# Patient Record
Sex: Female | Born: 1950
Health system: Southern US, Community
[De-identification: ages and names within clinical notes are randomized; demographics above are authoritative.]

## PROBLEM LIST (undated history)

## (undated) DIAGNOSIS — I10 Essential (primary) hypertension: Secondary | ICD-10-CM

## (undated) DIAGNOSIS — E119 Type 2 diabetes mellitus without complications: Secondary | ICD-10-CM

---

## 2001-09-12 ENCOUNTER — Encounter: Admission: RE | Admit: 2001-09-12 | Discharge: 2001-12-11 | Payer: Self-pay

## 2002-10-09 ENCOUNTER — Encounter: Admission: RE | Admit: 2002-10-09 | Discharge: 2003-01-07 | Payer: Self-pay | Admitting: Family Medicine

## 2002-10-30 ENCOUNTER — Encounter: Admission: RE | Admit: 2002-10-30 | Discharge: 2003-01-28 | Payer: Self-pay

## 2004-12-20 ENCOUNTER — Other Ambulatory Visit: Admission: RE | Admit: 2004-12-20 | Discharge: 2004-12-20 | Payer: Self-pay | Admitting: Family Medicine

## 2005-03-01 ENCOUNTER — Encounter: Admission: RE | Admit: 2005-03-01 | Discharge: 2005-05-30 | Payer: Self-pay | Admitting: Family Medicine

## 2006-05-02 ENCOUNTER — Encounter: Admission: RE | Admit: 2006-05-02 | Discharge: 2006-05-02 | Payer: Self-pay | Admitting: *Deleted

## 2006-05-03 ENCOUNTER — Ambulatory Visit (HOSPITAL_COMMUNITY): Admission: RE | Admit: 2006-05-03 | Discharge: 2006-05-04 | Payer: Self-pay | Admitting: *Deleted

## 2007-03-28 ENCOUNTER — Ambulatory Visit (HOSPITAL_BASED_OUTPATIENT_CLINIC_OR_DEPARTMENT_OTHER): Admission: RE | Admit: 2007-03-28 | Discharge: 2007-03-28 | Payer: Self-pay | Admitting: Orthopedic Surgery

## 2007-03-28 ENCOUNTER — Encounter (INDEPENDENT_AMBULATORY_CARE_PROVIDER_SITE_OTHER): Payer: Self-pay | Admitting: Specialist

## 2007-05-09 ENCOUNTER — Ambulatory Visit (HOSPITAL_BASED_OUTPATIENT_CLINIC_OR_DEPARTMENT_OTHER): Admission: RE | Admit: 2007-05-09 | Discharge: 2007-05-09 | Payer: Self-pay | Admitting: Orthopedic Surgery

## 2007-05-29 ENCOUNTER — Encounter: Admission: RE | Admit: 2007-05-29 | Discharge: 2007-06-19 | Payer: Self-pay | Admitting: Family Medicine

## 2008-04-24 ENCOUNTER — Other Ambulatory Visit: Admission: RE | Admit: 2008-04-24 | Discharge: 2008-04-24 | Payer: Self-pay | Admitting: Family Medicine

## 2011-04-26 NOTE — Op Note (Signed)
[name], Susan Camacho                 ACCOUNT NO.:  [id number]   MEDICAL RECORD NO.:  [id number]          PATIENT TYPE:  AMB   LOCATION:  DSC                          FACILITY:  [facility]   PHYSICIAN:  Cindee Salt, M.D.       DATE OF BIRTH:  09-27-1951   DATE OF PROCEDURE:  05/09/2007  DATE OF DISCHARGE:                               OPERATIVE REPORT   PREOPERATIVE DIAGNOSIS:  Stenosing tenosynovitis, left ring finger.   POSTOPERATIVE DIAGNOSIS:  Stenosing tenosynovitis, left ring finger.   OPERATION:  Release A1 pulley, left ring finger.   SURGEON:  Cindee Salt, M.D.   ASSISTANT:  __________, R.N.   ANESTHESIA:  Forearm based IV regional.   HISTORY:  The patient is a 60 year old female with a history of  diabetes, triggering of her fingers in bilateral hands.  She has  undergone release on her right.  She is admitted now for release of the  left ring finger.  She is aware of risks and complications including  infection, recurrence, injury to arteries, nerves and tendons,  incomplete relief of symptoms, dystrophy.  She has elected to proceed to  have this done.  In the preoperative area, questions were encouraged and  answered.  The extremity was marked by both the patient and surgeon.  Antibiotic was given.   PROCEDURE:  The patient was brought to the operating room.  A forearm  based IV regional anesthetic was carried out without difficulty.  She  was prepped using DuraPrep in the supine position with the left arm  free.   An oblique incision was made over the A1 pulley of the left ring finger  and carried down through subcutaneous tissue.  Bleeders were  electrocauterized.  Neurovascular structures were identified and  protected.  Retractors were placed.  The A1 pulley was then released on  the radial aspect.  A small incision was made centrally in the A2  pulley.  The finger was placed through a full range motion.  No further  triggering was evident.  The wound was then  irrigated.   The skin was closed with interrupted 5-0 Vicryl Rapide sutures.  A  sterile compressive dressing was applied.   The patient tolerated the procedure well and was taken to the recovery  room for observation in satisfactory condition.  She is discharged to  return the [facility] [place] in one week on Vicodin.          ______________________________  Cindee Salt, M.D.    GK/MEDQ  D:  05/09/2007  T:  05/09/2007  Job:  [id number]   cc:   Pam Drown, M.D.

## 2011-04-29 NOTE — Cardiovascular Report (Signed)
NAMEZAILA, CREW                 ACCOUNT NO.:  [id number]   MEDICAL RECORD NO.:  [id number]          PATIENT TYPE:  OIB   LOCATION:  2807                         FACILITY:  [facility]   PHYSICIAN:  Meade Maw, M.D.    DATE OF BIRTH:  06-04-1951   DATE OF PROCEDURE:  DATE OF DISCHARGE:                              CARDIAC CATHETERIZATION   INDICATIONS FOR PROCEDURE:  Ongoing chest pressure, inferoapical ischemia  demonstrated on Cardiolite.   PROCEDURE:  After obtaining written informed consent, the patient was  brought to the cardiac catheterization lab in post absorptive state.  Preop  sedation was achieved using Versed.  A total Versed 4 mg and fentanyl 50  mcg.  The right groin was prepped and draped in the usual sterile fashion.  Local anesthesia was achieved using 1% Xylocaine.  Due to the patient's body  habitus, there was difficulty in obtaining access into the right femoral  artery.  The right femoral vein was actually medial to the right femoral  artery, and access into the right femoral vein was initially obtained.  A 6-  [place] hemostasis sheath was initially placed into the right femoral vein.  This was subsequently changed over to a 7-French hemostasis sheath to allow  right heart catheterization pressures.  Dr. Michaelle Copas assistance was obtained  to assist in the placement of a 6-French hemostasis sheath into the right  femoral artery.  Selective coronary angiography was performed using JL-4, JR-  4 Judkins catheter.  Multiple views were obtained.  All catheter exchanges  were made over a guidewire.  Single-plane ventriculogram was performed in  the RAO position.  Right heart pressures were obtained from the pulmonary  artery, pulmonary wedge, right ventricle and right atrial pressure.  Thermodilution cardiac outputs were obtained as well.   FINDINGS:  The RA pressure mean was 16.  The RV pressure was 42/15.  PA  pressure was 34/22.  Wedge pressure 22.  Aortic pressure  was 154/94.  LV  pressure was 150/14.  Her end-diastolic pressure was 20.  Her thick cardiac  output was 6.20.  Thermodilutional cardiac output was 5.90.  Single-plane  ventriculogram revealed normal wall motion, ejection fraction of 65%.  There  was no significant mitral regurgitation noted.   CORONARY ANGIOGRAPHY:  The left main coronary artery bifurcates into the  left anterior descending and circumflex vessel.  There was no significant  disease noted in the left main coronary artery, left anterior descending.  Left anterior descending gives rise to a large first diagonal and goes on as  an apical branch.  There is no disease noted in the left anterior descending  or its branches.  Circumflex vessel:  The circumflex vessel is a large  caliber vessel that gives rise to early large OM1, large OM2.  There is no  disease noted in the circumflex or its branches.  Right coronary artery:  Right coronary artery is dominant for the posterior circulation, gives rise  to trivial RV marginals, a small to moderate PDA and small PL branch.  There  was no disease noted in the right coronary  artery or its branches.   FINAL IMPRESSION:  1.  Normal coronary angiography.  2.  Normal single-plane ventriculogram.  3.  Elevation of the end-diastolic pressure consistent with mild diastolic      dysfunction, most likely accounting for her dyspnea.  4.  Minimal elevation in the right heart pressure, most likely secondary to      obesity and sleep apnea.   RECOMMENDATIONS:  1.  Weight loss and initiation of a regular exercise program.  2.  The patient's hypertension needs to be aggressively treated to avoid      further elevation in her diastolic pressure.      Meade Maw, M.D.  Electronically Signed     HP/MEDQ  D:  05/03/2006  T:  05/04/2006  Job:  [id number]

## 2011-04-29 NOTE — Op Note (Signed)
Susan Camacho, Susan Camacho                 ACCOUNT NO.:  [id number]   MEDICAL RECORD NO.:  [id number]          PATIENT TYPE:  AMB   LOCATION:  DSC                          FACILITY:  [facility]   PHYSICIAN:  Cindee Salt, M.D.       DATE OF BIRTH:  1951/12/05   DATE OF PROCEDURE:  03/28/2007  DATE OF DISCHARGE:                               OPERATIVE REPORT   PREOPERATIVE DIAGNOSIS:  Stenosing tenosynovitis right ring finger.   POSTOPERATIVE DIAGNOSIS:  Stenosing tenosynovitis right ring finger.   OPERATION:  Release A1 pulley right ring finger with tenosynovectomy  flexor tendons.   SURGEON:  Cindee Salt, M.D.   ASSISTANTCarolyne Fiscal.   ANESTHESIA:  Forearm based IV regional.   HISTORY:  The patient is a 60 year old female with a history of  triggering of her right ring finger.  This has not responded to  conservative treatment.  She is desirous of proceeding to have this  surgically released.  She is aware of risks and complications including  recurrence, injury to arteries, nerves, tendons, incomplete relief of  symptoms, dystrophy.  In preoperative area she is seen, questions  encouraged and answered.  Antibiotic given.   PROCEDURE:  The patient is brought to the operating room where a forearm  based IV regional anesthetic was carried out without difficulty.  She  was prepped using DuraPrep, supine position, right ring finger, right  arm free.  After adequate anesthesia was afforded, a oblique incision  was made over the A1 pulley.  The bleeders electrocauterized.  Dissection carried down to the A1 pulley which was identified.  A very  significant tenosynovitis was present proximally.  The A1 pulley was  released in its radial aspect.  A small incision made centrally in the  A2 pulley.  The finger placed through full range motion.  The  tenosynovium was extremely thickened.  This was excised and sent to  pathology.  The wound was then copiously irrigated with saline.  No  further triggering  was noted.  The wound was closed with interrupted 5-0  Vicryl Rapide sutures.  A sterile compressive dressing was applied.  The  patient tolerated the procedure well was taken to recovery room for  observation in satisfactory condition.  She will be discharged home to  return to [facility] [place] in one week on Vicodin.           ______________________________  Cindee Salt, M.D.     GK/MEDQ  D:  03/28/2007  T:  03/28/2007  Job:  [id number]   cc:   Pam Drown, M.D.

## 2012-04-26 ENCOUNTER — Other Ambulatory Visit (HOSPITAL_COMMUNITY)
Admission: RE | Admit: 2012-04-26 | Discharge: 2012-04-26 | Disposition: A | Discharge status: Home / Self Care | Payer: 59 | Source: Ambulatory Visit | Attending: Family Medicine | Admitting: Family Medicine

## 2012-04-26 ENCOUNTER — Other Ambulatory Visit: Payer: Self-pay | Admitting: Family Medicine

## 2012-04-26 DIAGNOSIS — Z1159 Encounter for screening for other viral diseases: Secondary | ICD-10-CM | POA: Insufficient documentation

## 2012-04-26 DIAGNOSIS — Z124 Encounter for screening for malignant neoplasm of cervix: Secondary | ICD-10-CM | POA: Insufficient documentation

## 2015-03-14 ENCOUNTER — Emergency Department (HOSPITAL_COMMUNITY): Payer: 59

## 2015-03-14 ENCOUNTER — Encounter (HOSPITAL_COMMUNITY): Payer: Self-pay | Admitting: Emergency Medicine

## 2015-03-14 ENCOUNTER — Emergency Department (HOSPITAL_COMMUNITY)
Admission: EM | Admit: 2015-03-14 | Discharge: 2015-03-14 | Disposition: A | Discharge status: Home / Self Care | Payer: 59 | Attending: Emergency Medicine | Admitting: Emergency Medicine

## 2015-03-14 DIAGNOSIS — S199XXA Unspecified injury of neck, initial encounter: Secondary | ICD-10-CM | POA: Diagnosis not present

## 2015-03-14 DIAGNOSIS — E119 Type 2 diabetes mellitus without complications: Secondary | ICD-10-CM | POA: Insufficient documentation

## 2015-03-14 DIAGNOSIS — S20211A Contusion of right front wall of thorax, initial encounter: Secondary | ICD-10-CM | POA: Diagnosis not present

## 2015-03-14 DIAGNOSIS — Y92410 Unspecified street and highway as the place of occurrence of the external cause: Secondary | ICD-10-CM | POA: Insufficient documentation

## 2015-03-14 DIAGNOSIS — S4990XA Unspecified injury of shoulder and upper arm, unspecified arm, initial encounter: Secondary | ICD-10-CM | POA: Diagnosis not present

## 2015-03-14 DIAGNOSIS — S299XXA Unspecified injury of thorax, initial encounter: Secondary | ICD-10-CM | POA: Diagnosis present

## 2015-03-14 DIAGNOSIS — Y9389 Activity, other specified: Secondary | ICD-10-CM | POA: Diagnosis not present

## 2015-03-14 DIAGNOSIS — I10 Essential (primary) hypertension: Secondary | ICD-10-CM | POA: Diagnosis not present

## 2015-03-14 DIAGNOSIS — Y998 Other external cause status: Secondary | ICD-10-CM | POA: Insufficient documentation

## 2015-03-14 HISTORY — DX: Essential (primary) hypertension: I10

## 2015-03-14 HISTORY — DX: Type 2 diabetes mellitus without complications: E11.9
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
## 2015-03-14 NOTE — Discharge Instructions (Signed)

## 2015-03-14 NOTE — ED Notes (Signed)
Pt alert, oriented,and ambulatory upon DC. She was advised to follow up with PCP if not better.

## 2015-03-26 DIAGNOSIS — M7989 Other specified soft tissue disorders: Secondary | ICD-10-CM | POA: Diagnosis not present

## 2015-03-27 ENCOUNTER — Other Ambulatory Visit (HOSPITAL_COMMUNITY): Payer: Self-pay | Admitting: Family Medicine

## 2015-03-27 ENCOUNTER — Other Ambulatory Visit: Payer: Self-pay | Admitting: Family Medicine

## 2015-03-27 DIAGNOSIS — R1905 Periumbilic swelling, mass or lump: Secondary | ICD-10-CM

## 2015-03-27 DIAGNOSIS — M7989 Other specified soft tissue disorders: Secondary | ICD-10-CM

## 2015-03-28 ENCOUNTER — Ambulatory Visit (HOSPITAL_COMMUNITY)
Admission: RE | Admit: 2015-03-28 | Discharge: 2015-03-28 | Disposition: A | Discharge status: Home / Self Care | Payer: 59 | Source: Ambulatory Visit | Attending: Family Medicine | Admitting: Family Medicine

## 2015-03-28 DIAGNOSIS — M7989 Other specified soft tissue disorders: Secondary | ICD-10-CM | POA: Diagnosis present

## 2015-03-28 DIAGNOSIS — M79606 Pain in leg, unspecified: Secondary | ICD-10-CM | POA: Diagnosis not present

## 2015-03-28 NOTE — Progress Notes (Signed)
VASCULAR LAB PRELIMINARY  PRELIMINARY  PRELIMINARY  PRELIMINARY  Left lower extremity venous Doppler completed.    Preliminary report:  There is no DVT or SVT noted in the left lower extremity.   Santia Labate, RVT 03/28/2015, 8:59 AM

## 2015-03-30 ENCOUNTER — Ambulatory Visit
Admission: RE | Admit: 2015-03-30 | Discharge: 2015-03-30 | Disposition: A | Discharge status: Home / Self Care | Payer: 59 | Source: Ambulatory Visit | Attending: Family Medicine | Admitting: Family Medicine

## 2015-03-30 DIAGNOSIS — R1905 Periumbilic swelling, mass or lump: Secondary | ICD-10-CM

## 2016-08-05 ENCOUNTER — Encounter (HOSPITAL_COMMUNITY): Payer: Self-pay | Admitting: Emergency Medicine

## 2016-08-05 ENCOUNTER — Emergency Department (HOSPITAL_COMMUNITY): Payer: BLUE CROSS/BLUE SHIELD

## 2016-08-05 DIAGNOSIS — Y9389 Activity, other specified: Secondary | ICD-10-CM | POA: Diagnosis not present

## 2016-08-05 DIAGNOSIS — M791 Myalgia: Secondary | ICD-10-CM | POA: Diagnosis not present

## 2016-08-05 DIAGNOSIS — M25512 Pain in left shoulder: Secondary | ICD-10-CM | POA: Diagnosis not present

## 2016-08-05 DIAGNOSIS — Z7984 Long term (current) use of oral hypoglycemic drugs: Secondary | ICD-10-CM | POA: Insufficient documentation

## 2016-08-05 DIAGNOSIS — Y929 Unspecified place or not applicable: Secondary | ICD-10-CM | POA: Diagnosis not present

## 2016-08-05 DIAGNOSIS — I10 Essential (primary) hypertension: Secondary | ICD-10-CM | POA: Diagnosis not present

## 2016-08-05 DIAGNOSIS — Z79899 Other long term (current) drug therapy: Secondary | ICD-10-CM | POA: Diagnosis not present

## 2016-08-05 DIAGNOSIS — Z794 Long term (current) use of insulin: Secondary | ICD-10-CM | POA: Diagnosis not present

## 2016-08-05 DIAGNOSIS — X500XXA Overexertion from strenuous movement or load, initial encounter: Secondary | ICD-10-CM | POA: Diagnosis not present

## 2016-08-05 DIAGNOSIS — E119 Type 2 diabetes mellitus without complications: Secondary | ICD-10-CM | POA: Diagnosis not present

## 2016-08-05 DIAGNOSIS — Y999 Unspecified external cause status: Secondary | ICD-10-CM | POA: Diagnosis not present

## 2016-08-05 NOTE — ED Triage Notes (Signed)
Pt c/o L shoulder pain x 3 days, pt states she lifted a heavy chair last week, then this week she had to pull start a lawn mower and feels this exacerbated problem

## 2016-08-06 ENCOUNTER — Emergency Department (HOSPITAL_COMMUNITY)
Admission: EM | Admit: 2016-08-06 | Discharge: 2016-08-06 | Disposition: A | Discharge status: Home / Self Care | Payer: BLUE CROSS/BLUE SHIELD | Attending: Emergency Medicine | Admitting: Emergency Medicine

## 2016-08-06 DIAGNOSIS — M25512 Pain in left shoulder: Secondary | ICD-10-CM

## 2016-08-06 DIAGNOSIS — M7918 Myalgia, other site: Secondary | ICD-10-CM

## 2016-08-06 NOTE — ED Provider Notes (Signed)
[place] DEPT Provider Note   CSN: [id number] Arrival date & time: 08/05/16  2240  By signing my name below, I, Reola Mosher, attest that this documentation has been prepared under the direction and in the presence of Shary Decamp, PA-C.  Electronically Signed: Reola Mosher, ED Scribe. 08/06/16. 1:49 AM.  History   Chief Complaint Chief Complaint  Patient presents with  . Shoulder Pain   The history is provided by the patient. No language interpreter was used.   HPI Comments: Susan Camacho is a 65 y.o. female with a PMHx of DM and HTN, who presents to the Emergency Department complaining of gradual onset, unchanged, aching and throbbing, constant, 5/10 left shoulder pain x ~3 days. Pt reports that she lifted a heavy chair ~3 days ago prior to the onset of her pain. She notes that her pain was not initially present, but states that she was pulling a lawnmower start chain yesterday which exacerbated her pain. She denies any known trauma or injury to sustain her pain otherwise. Her pain is mildly relieved with relaxing and sitting still. She has been taking Tylenol and applying heat to the area intermittently with mild relief of her pain as well. She is currently followed by a PCP. Denies nausea, vomiting, CP, or SOB.  Past Medical History:  Diagnosis Date  . Diabetes mellitus without complication ([place])   . Hypertension    There are no active problems to display for this patient.  History reviewed. No pertinent surgical history.  OB History    No data available     Home Medications    Prior to Admission medications   Medication Sig Start Date End Date Taking? Authorizing Provider  acetaminophen (TYLENOL) 650 MG CR tablet Take 650 mg by mouth 2 (two) times daily as needed for pain.    Historical Provider, MD  amLODipine (NORVASC) 10 MG tablet Take 10 mg by mouth daily with breakfast.    Historical Provider, MD  buPROPion (WELLBUTRIN XL) 300 MG 24 hr tablet Take  300 mg by mouth daily with breakfast.  02/16/15   Historical Provider, MD  carvedilol (COREG) 12.5 MG tablet Take 12.5 mg by mouth 2 (two) times daily with a meal.    Historical Provider, MD  Cholecalciferol (VITAMIN D) 2000 UNITS tablet Take 2,000 Units by mouth daily with breakfast.    Historical Provider, MD  famotidine (PEPCID) 20 MG tablet Take 20 mg by mouth daily as needed for heartburn or indigestion.    Historical Provider, MD  FLUoxetine (PROZAC) 20 MG capsule Take 20 mg by mouth daily with breakfast.    Historical Provider, MD  furosemide (LASIX) 20 MG tablet Take 20 mg by mouth daily.    Historical Provider, MD  glimepiride (AMARYL) 4 MG tablet Take 4 mg by mouth 2 (two) times daily.    Historical Provider, MD  insulin glargine (LANTUS) 100 UNIT/ML injection Inject 40 Units into the skin at bedtime.    Historical Provider, MD  lisinopril-hydrochlorothiazide (PRINZIDE,ZESTORETIC) 20-25 MG per tablet Take 1 tablet by mouth daily with breakfast.    Historical Provider, MD  LORazepam (ATIVAN) 0.5 MG tablet Take 0.5 mg by mouth at bedtime as needed for sleep.    Historical Provider, MD  metFORMIN (GLUCOPHAGE) 500 MG tablet Take 1,000 mg by mouth 2 (two) times daily with a meal.    Historical Provider, MD  Multiple Vitamin (MULTIVITAMIN WITH MINERALS) TABS tablet Take 1 tablet by mouth daily with breakfast.  Historical Provider, MD  Omega-3 Krill Oil 300 MG CAPS Take 300 mg by mouth daily with breakfast.    Historical Provider, MD  rosuvastatin (CRESTOR) 5 MG tablet Take 5 mg by mouth every evening.    Historical Provider, MD   Family History No family history on file.  Social History Social History  Substance Use Topics  . Smoking status: Never Smoker  . Smokeless tobacco: Never Used  . Alcohol use No   Allergies   Review of patient's allergies indicates no known allergies.  Review of Systems Review of Systems  Respiratory: Negative for shortness of breath.   Cardiovascular:  Negative for chest pain.  Gastrointestinal: Negative for nausea and vomiting.  Musculoskeletal: Positive for arthralgias (left shoulder) and myalgias.   Physical Exam Updated Vital Signs BP 182/84 (BP Location: Left Arm)   Pulse 102   Temp 98.9 F (37.2 C) (Oral)   Resp 18   Ht 4\' 11"  (1.499 m)   Wt 208 lb (94.3 kg)   SpO2 98%   BMI 42.01 kg/m   Physical Exam  Constitutional: She appears well-developed and well-nourished.  HENT:  Head: Normocephalic.  Eyes: Conjunctivae are normal.  Cardiovascular: Normal rate.   Pulmonary/Chest: Effort normal. No respiratory distress.  Abdominal: She exhibits no distension.  Musculoskeletal: Normal range of motion. She exhibits tenderness. She exhibits no edema or deformity.  Negative hawkins test, negative Neer's test. TTP over the pectoralis major muscle. No pain with flexion/extension/abduction/adduction internal or external rotation. No obvious bony deformity.  Neurological: She is alert.  Skin: Skin is warm and dry.  Psychiatric: She has a normal mood and affect. Her behavior is normal.  Nursing note and vitals reviewed.  ED Treatments / Results  DIAGNOSTIC STUDIES: Oxygen Saturation is 98% on RA, normal by my interpretation.   COORDINATION OF CARE: 1:49 AM-Discussed next steps with pt including DG left shoulder. Pt verbalized understanding and is agreeable with the plan.   Radiology Dg Shoulder Left  Result Date: 08/05/2016 CLINICAL DATA:  Injury to the left shoulder about a week ago with worsening pain since then. EXAM: LEFT SHOULDER - 2+ VIEW COMPARISON:  None. FINDINGS: There is no evidence of fracture or dislocation. There is no evidence of arthropathy or other focal bone abnormality. Soft tissues are unremarkable. IMPRESSION: Negative. Electronically Signed   By: Lucienne Capers M.D.   On: 08/05/2016 23:54   Procedures Procedures (including critical care time)  Medications Ordered in ED Medications - No data to  display  Initial Impression / Assessment and Plan / ED Course  I have reviewed the triage vital signs and the nursing notes.  Pertinent labs & imaging results that were available during my care of the patient were reviewed by me and considered in my medical decision making (see chart for details).  Clinical Course   Final Clinical Impressions(s) / ED Diagnoses  I have reviewed and evaluated the relevant imaging studies.  I have reviewed the relevant previous healthcare records. I obtained HPI from historian.  ED Course:  Assessment: Pt is a 65yF who presents with left shoulder pain s/p pulling lawnmower. On exam, pt in NAD. Nontoxic/nonseptic appearing. VSS. Afebrile. ROM intact of shoulder. NO obvious deformities. NVI. Rotator cuff intact. TTP along pectoralis major muscle. Likely musculoskeletal in nature.  Imaging without fracture. Plan is to DC home with follow up to PCP. Counseled on heat and ice and NSAID therapy. At time of discharge, Patient is in no acute distress. Vital Signs are stable. Patient  is able to ambulate. Patient able to tolerate PO. \  Disposition/Plan:  DC Home Additional Verbal discharge instructions given and discussed with patient.  Pt Instructed to f/u with PCP in the next week for evaluation and treatment of symptoms. Return precautions given Pt acknowledges and agrees with plan  Supervising Physician Jola Schmidt, MD   Final diagnoses:  Left shoulder pain  Musculoskeletal pain    New Prescriptions New Prescriptions   No medications on file    I personally performed the services described in this documentation, which was scribed in my presence. The recorded information has been reviewed and is accurate.    Shary Decamp, PA-C 08/06/16 [id number]    Jola Schmidt, MD 08/06/16 [phone]

## 2016-08-06 NOTE — Discharge Instructions (Signed)
Please read and follow all provided instructions.  Your diagnoses today include:  1. Left shoulder pain   2. Musculoskeletal pain    Tests performed today include: Vital signs. See below for your results today.   Medications prescribed:  Take as prescribed   Home care instructions:  Follow any educational materials contained in this packet.  Follow-up instructions: Please follow-up with your primary care provider for further evaluation of symptoms and treatment   Return instructions:  Please return to the Emergency Department if you do not get better, if you get worse, or new symptoms OR  - Fever (temperature greater than 101.75F)  - Bleeding that does not stop with holding pressure to the area    -Severe pain (please note that you may be more sore the day after your accident)  - Chest Pain  - Difficulty breathing  - Severe nausea or vomiting  - Inability to tolerate food and liquids  - Passing out  - Skin becoming red around your wounds  - Change in mental status (confusion or lethargy)  - New numbness or weakness    Please return if you have any other emergent concerns.  Additional Information:  Your vital signs today were: BP 182/84 (BP Location: Left Arm)    Pulse 102    Temp 98.9 F (37.2 C) (Oral)    Resp 18    Ht 4\' 11"  (1.499 m)    Wt 94.3 kg    SpO2 98%    BMI 42.01 kg/m  If your blood pressure (BP) was elevated above 135/85 this visit, please have this repeated by your doctor within one month. ---------------
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
## 2017-06-27 DIAGNOSIS — H2513 Age-related nuclear cataract, bilateral: Secondary | ICD-10-CM | POA: Diagnosis not present

## 2017-06-27 DIAGNOSIS — I10 Essential (primary) hypertension: Secondary | ICD-10-CM | POA: Diagnosis not present

## 2017-06-27 DIAGNOSIS — E119 Type 2 diabetes mellitus without complications: Secondary | ICD-10-CM | POA: Diagnosis not present

## 2017-06-27 DIAGNOSIS — H40003 Preglaucoma, unspecified, bilateral: Secondary | ICD-10-CM | POA: Diagnosis not present

## 2017-07-06 DIAGNOSIS — M79674 Pain in right toe(s): Secondary | ICD-10-CM | POA: Diagnosis not present

## 2017-07-06 DIAGNOSIS — F064 Anxiety disorder due to known physiological condition: Secondary | ICD-10-CM | POA: Diagnosis not present

## 2017-07-06 DIAGNOSIS — I10 Essential (primary) hypertension: Secondary | ICD-10-CM | POA: Diagnosis not present

## 2017-07-06 DIAGNOSIS — E782 Mixed hyperlipidemia: Secondary | ICD-10-CM | POA: Diagnosis not present

## 2017-07-06 DIAGNOSIS — G4700 Insomnia, unspecified: Secondary | ICD-10-CM | POA: Diagnosis not present

## 2017-07-06 DIAGNOSIS — E781 Pure hyperglyceridemia: Secondary | ICD-10-CM | POA: Diagnosis not present

## 2017-07-06 DIAGNOSIS — Z23 Encounter for immunization: Secondary | ICD-10-CM | POA: Diagnosis not present

## 2017-07-06 DIAGNOSIS — M159 Polyosteoarthritis, unspecified: Secondary | ICD-10-CM | POA: Diagnosis not present

## 2017-07-06 DIAGNOSIS — G4730 Sleep apnea, unspecified: Secondary | ICD-10-CM | POA: Diagnosis not present

## 2017-07-06 DIAGNOSIS — F3342 Major depressive disorder, recurrent, in full remission: Secondary | ICD-10-CM | POA: Diagnosis not present

## 2017-07-06 DIAGNOSIS — E1165 Type 2 diabetes mellitus with hyperglycemia: Secondary | ICD-10-CM | POA: Diagnosis not present

## 2017-07-06 DIAGNOSIS — E669 Obesity, unspecified: Secondary | ICD-10-CM | POA: Diagnosis not present

## 2017-07-07 DIAGNOSIS — M159 Polyosteoarthritis, unspecified: Secondary | ICD-10-CM | POA: Diagnosis not present

## 2017-07-07 DIAGNOSIS — E1165 Type 2 diabetes mellitus with hyperglycemia: Secondary | ICD-10-CM | POA: Diagnosis not present

## 2017-07-07 DIAGNOSIS — Z23 Encounter for immunization: Secondary | ICD-10-CM | POA: Diagnosis not present

## 2017-07-07 DIAGNOSIS — Z794 Long term (current) use of insulin: Secondary | ICD-10-CM | POA: Diagnosis not present

## 2017-07-07 DIAGNOSIS — E782 Mixed hyperlipidemia: Secondary | ICD-10-CM | POA: Diagnosis not present

## 2017-07-07 DIAGNOSIS — G4730 Sleep apnea, unspecified: Secondary | ICD-10-CM | POA: Diagnosis not present

## 2017-07-07 DIAGNOSIS — I10 Essential (primary) hypertension: Secondary | ICD-10-CM | POA: Diagnosis not present

## 2017-07-07 DIAGNOSIS — F3342 Major depressive disorder, recurrent, in full remission: Secondary | ICD-10-CM | POA: Diagnosis not present

## 2017-08-22 DIAGNOSIS — G479 Sleep disorder, unspecified: Secondary | ICD-10-CM | POA: Diagnosis not present

## 2017-08-22 DIAGNOSIS — F3341 Major depressive disorder, recurrent, in partial remission: Secondary | ICD-10-CM | POA: Diagnosis not present

## 2017-08-22 DIAGNOSIS — Z1159 Encounter for screening for other viral diseases: Secondary | ICD-10-CM | POA: Diagnosis not present

## 2017-08-22 DIAGNOSIS — M85852 Other specified disorders of bone density and structure, left thigh: Secondary | ICD-10-CM | POA: Diagnosis not present

## 2017-08-22 DIAGNOSIS — Z7189 Other specified counseling: Secondary | ICD-10-CM | POA: Diagnosis not present

## 2017-08-22 DIAGNOSIS — Z1231 Encounter for screening mammogram for malignant neoplasm of breast: Secondary | ICD-10-CM | POA: Diagnosis not present

## 2017-08-22 DIAGNOSIS — Z0000 Encounter for general adult medical examination without abnormal findings: Secondary | ICD-10-CM | POA: Diagnosis not present

## 2017-08-22 DIAGNOSIS — Z23 Encounter for immunization: Secondary | ICD-10-CM | POA: Diagnosis not present

## 2017-08-22 DIAGNOSIS — G4730 Sleep apnea, unspecified: Secondary | ICD-10-CM | POA: Diagnosis not present

## 2017-08-22 DIAGNOSIS — Z01419 Encounter for gynecological examination (general) (routine) without abnormal findings: Secondary | ICD-10-CM | POA: Diagnosis not present

## 2017-08-28 DIAGNOSIS — M8589 Other specified disorders of bone density and structure, multiple sites: Secondary | ICD-10-CM | POA: Diagnosis not present

## 2017-08-28 DIAGNOSIS — Z1231 Encounter for screening mammogram for malignant neoplasm of breast: Secondary | ICD-10-CM | POA: Diagnosis not present

## 2017-09-08 DIAGNOSIS — M85852 Other specified disorders of bone density and structure, left thigh: Secondary | ICD-10-CM | POA: Diagnosis not present

## 2017-10-05 DIAGNOSIS — G4733 Obstructive sleep apnea (adult) (pediatric): Secondary | ICD-10-CM | POA: Diagnosis not present

## 2017-10-05 DIAGNOSIS — G4721 Circadian rhythm sleep disorder, delayed sleep phase type: Secondary | ICD-10-CM | POA: Diagnosis not present

## 2017-10-11 DIAGNOSIS — Z1211 Encounter for screening for malignant neoplasm of colon: Secondary | ICD-10-CM | POA: Diagnosis not present

## 2018-01-03 DIAGNOSIS — R05 Cough: Secondary | ICD-10-CM | POA: Diagnosis not present

## 2018-01-19 DIAGNOSIS — R69 Illness, unspecified: Secondary | ICD-10-CM | POA: Diagnosis not present

## 2018-03-15 DIAGNOSIS — R69 Illness, unspecified: Secondary | ICD-10-CM | POA: Diagnosis not present

## 2018-03-15 DIAGNOSIS — E669 Obesity, unspecified: Secondary | ICD-10-CM | POA: Diagnosis not present

## 2018-03-15 DIAGNOSIS — E782 Mixed hyperlipidemia: Secondary | ICD-10-CM | POA: Diagnosis not present

## 2018-03-15 DIAGNOSIS — Z794 Long term (current) use of insulin: Secondary | ICD-10-CM | POA: Diagnosis not present

## 2018-03-15 DIAGNOSIS — I10 Essential (primary) hypertension: Secondary | ICD-10-CM | POA: Diagnosis not present

## 2018-03-15 DIAGNOSIS — Z1159 Encounter for screening for other viral diseases: Secondary | ICD-10-CM | POA: Diagnosis not present

## 2018-03-15 DIAGNOSIS — E1169 Type 2 diabetes mellitus with other specified complication: Secondary | ICD-10-CM | POA: Diagnosis not present

## 2018-03-15 DIAGNOSIS — M85852 Other specified disorders of bone density and structure, left thigh: Secondary | ICD-10-CM | POA: Diagnosis not present

## 2018-03-15 DIAGNOSIS — G4730 Sleep apnea, unspecified: Secondary | ICD-10-CM | POA: Diagnosis not present

## 2018-04-03 DIAGNOSIS — R69 Illness, unspecified: Secondary | ICD-10-CM | POA: Diagnosis not present

## 2018-06-28 DIAGNOSIS — R69 Illness, unspecified: Secondary | ICD-10-CM | POA: Diagnosis not present

## 2018-07-03 DIAGNOSIS — H25043 Posterior subcapsular polar age-related cataract, bilateral: Secondary | ICD-10-CM | POA: Diagnosis not present

## 2018-07-03 DIAGNOSIS — H40013 Open angle with borderline findings, low risk, bilateral: Secondary | ICD-10-CM | POA: Diagnosis not present

## 2018-07-03 DIAGNOSIS — H25013 Cortical age-related cataract, bilateral: Secondary | ICD-10-CM | POA: Diagnosis not present

## 2018-07-03 DIAGNOSIS — H2513 Age-related nuclear cataract, bilateral: Secondary | ICD-10-CM | POA: Diagnosis not present

## 2018-07-24 DIAGNOSIS — G4733 Obstructive sleep apnea (adult) (pediatric): Secondary | ICD-10-CM | POA: Diagnosis not present

## 2018-07-24 DIAGNOSIS — R69 Illness, unspecified: Secondary | ICD-10-CM | POA: Diagnosis not present

## 2018-07-24 DIAGNOSIS — R609 Edema, unspecified: Secondary | ICD-10-CM | POA: Diagnosis not present

## 2018-07-24 DIAGNOSIS — G8929 Other chronic pain: Secondary | ICD-10-CM | POA: Diagnosis not present

## 2018-07-24 DIAGNOSIS — Z794 Long term (current) use of insulin: Secondary | ICD-10-CM | POA: Diagnosis not present

## 2018-07-24 DIAGNOSIS — I10 Essential (primary) hypertension: Secondary | ICD-10-CM | POA: Diagnosis not present

## 2018-07-24 DIAGNOSIS — E119 Type 2 diabetes mellitus without complications: Secondary | ICD-10-CM | POA: Diagnosis not present

## 2018-07-24 DIAGNOSIS — E785 Hyperlipidemia, unspecified: Secondary | ICD-10-CM | POA: Diagnosis not present

## 2018-07-24 DIAGNOSIS — Z6839 Body mass index (BMI) 39.0-39.9, adult: Secondary | ICD-10-CM | POA: Diagnosis not present

## 2018-08-23 DIAGNOSIS — Z23 Encounter for immunization: Secondary | ICD-10-CM | POA: Diagnosis not present

## 2018-08-23 DIAGNOSIS — I10 Essential (primary) hypertension: Secondary | ICD-10-CM | POA: Diagnosis not present

## 2018-08-23 DIAGNOSIS — E782 Mixed hyperlipidemia: Secondary | ICD-10-CM | POA: Diagnosis not present

## 2018-08-23 DIAGNOSIS — R69 Illness, unspecified: Secondary | ICD-10-CM | POA: Diagnosis not present

## 2018-08-23 DIAGNOSIS — Z1211 Encounter for screening for malignant neoplasm of colon: Secondary | ICD-10-CM | POA: Diagnosis not present

## 2018-08-23 DIAGNOSIS — M159 Polyosteoarthritis, unspecified: Secondary | ICD-10-CM | POA: Diagnosis not present

## 2018-08-23 DIAGNOSIS — Z6839 Body mass index (BMI) 39.0-39.9, adult: Secondary | ICD-10-CM | POA: Diagnosis not present

## 2018-08-23 DIAGNOSIS — E1159 Type 2 diabetes mellitus with other circulatory complications: Secondary | ICD-10-CM | POA: Diagnosis not present

## 2018-08-23 DIAGNOSIS — M2041 Other hammer toe(s) (acquired), right foot: Secondary | ICD-10-CM | POA: Diagnosis not present

## 2018-08-23 DIAGNOSIS — Z0000 Encounter for general adult medical examination without abnormal findings: Secondary | ICD-10-CM | POA: Diagnosis not present

## 2018-08-23 DIAGNOSIS — Z0001 Encounter for general adult medical examination with abnormal findings: Secondary | ICD-10-CM | POA: Diagnosis not present

## 2018-08-31 DIAGNOSIS — M21961 Unspecified acquired deformity of right lower leg: Secondary | ICD-10-CM | POA: Diagnosis not present

## 2018-08-31 DIAGNOSIS — E139 Other specified diabetes mellitus without complications: Secondary | ICD-10-CM | POA: Diagnosis not present

## 2018-08-31 DIAGNOSIS — L603 Nail dystrophy: Secondary | ICD-10-CM | POA: Diagnosis not present

## 2018-08-31 DIAGNOSIS — L02611 Cutaneous abscess of right foot: Secondary | ICD-10-CM | POA: Diagnosis not present

## 2018-08-31 DIAGNOSIS — L03031 Cellulitis of right toe: Secondary | ICD-10-CM | POA: Diagnosis not present

## 2018-08-31 DIAGNOSIS — M79674 Pain in right toe(s): Secondary | ICD-10-CM | POA: Diagnosis not present

## 2018-09-10 DIAGNOSIS — L603 Nail dystrophy: Secondary | ICD-10-CM | POA: Diagnosis not present

## 2018-09-14 DIAGNOSIS — M2041 Other hammer toe(s) (acquired), right foot: Secondary | ICD-10-CM | POA: Diagnosis not present

## 2018-09-14 DIAGNOSIS — E139 Other specified diabetes mellitus without complications: Secondary | ICD-10-CM | POA: Diagnosis not present

## 2018-09-14 DIAGNOSIS — L03031 Cellulitis of right toe: Secondary | ICD-10-CM | POA: Diagnosis not present

## 2018-09-28 DIAGNOSIS — Z0100 Encounter for examination of eyes and vision without abnormal findings: Secondary | ICD-10-CM | POA: Diagnosis not present

## 2018-11-15 DIAGNOSIS — R69 Illness, unspecified: Secondary | ICD-10-CM | POA: Diagnosis not present

## 2019-02-21 DIAGNOSIS — G4733 Obstructive sleep apnea (adult) (pediatric): Secondary | ICD-10-CM | POA: Diagnosis not present

## 2019-02-21 DIAGNOSIS — I10 Essential (primary) hypertension: Secondary | ICD-10-CM | POA: Diagnosis not present

## 2019-02-21 DIAGNOSIS — E782 Mixed hyperlipidemia: Secondary | ICD-10-CM | POA: Diagnosis not present

## 2019-02-21 DIAGNOSIS — Z6839 Body mass index (BMI) 39.0-39.9, adult: Secondary | ICD-10-CM | POA: Diagnosis not present

## 2019-02-21 DIAGNOSIS — E1159 Type 2 diabetes mellitus with other circulatory complications: Secondary | ICD-10-CM | POA: Diagnosis not present

## 2019-02-21 DIAGNOSIS — Z79899 Other long term (current) drug therapy: Secondary | ICD-10-CM | POA: Diagnosis not present

## 2019-02-21 DIAGNOSIS — E1169 Type 2 diabetes mellitus with other specified complication: Secondary | ICD-10-CM | POA: Diagnosis not present

## 2019-02-21 DIAGNOSIS — Z0001 Encounter for general adult medical examination with abnormal findings: Secondary | ICD-10-CM | POA: Diagnosis not present

## 2019-02-21 DIAGNOSIS — M2041 Other hammer toe(s) (acquired), right foot: Secondary | ICD-10-CM | POA: Diagnosis not present

## 2019-02-21 DIAGNOSIS — R69 Illness, unspecified: Secondary | ICD-10-CM | POA: Diagnosis not present

## 2019-02-25 DIAGNOSIS — Z6839 Body mass index (BMI) 39.0-39.9, adult: Secondary | ICD-10-CM | POA: Diagnosis not present

## 2019-02-25 DIAGNOSIS — M85852 Other specified disorders of bone density and structure, left thigh: Secondary | ICD-10-CM | POA: Diagnosis not present

## 2019-02-25 DIAGNOSIS — G4733 Obstructive sleep apnea (adult) (pediatric): Secondary | ICD-10-CM | POA: Diagnosis not present

## 2019-02-25 DIAGNOSIS — E782 Mixed hyperlipidemia: Secondary | ICD-10-CM | POA: Diagnosis not present

## 2019-02-25 DIAGNOSIS — E1165 Type 2 diabetes mellitus with hyperglycemia: Secondary | ICD-10-CM | POA: Diagnosis not present

## 2019-02-25 DIAGNOSIS — I10 Essential (primary) hypertension: Secondary | ICD-10-CM | POA: Diagnosis not present

## 2019-02-25 DIAGNOSIS — R69 Illness, unspecified: Secondary | ICD-10-CM | POA: Diagnosis not present

## 2019-04-29 DIAGNOSIS — I10 Essential (primary) hypertension: Secondary | ICD-10-CM | POA: Diagnosis not present

## 2019-04-29 DIAGNOSIS — M1990 Unspecified osteoarthritis, unspecified site: Secondary | ICD-10-CM | POA: Diagnosis not present

## 2019-04-29 DIAGNOSIS — Z7982 Long term (current) use of aspirin: Secondary | ICD-10-CM | POA: Diagnosis not present

## 2019-04-29 DIAGNOSIS — E1165 Type 2 diabetes mellitus with hyperglycemia: Secondary | ICD-10-CM | POA: Diagnosis not present

## 2019-04-29 DIAGNOSIS — Z7984 Long term (current) use of oral hypoglycemic drugs: Secondary | ICD-10-CM | POA: Diagnosis not present

## 2019-04-29 DIAGNOSIS — E785 Hyperlipidemia, unspecified: Secondary | ICD-10-CM | POA: Diagnosis not present

## 2019-04-29 DIAGNOSIS — G8929 Other chronic pain: Secondary | ICD-10-CM | POA: Diagnosis not present

## 2019-04-29 DIAGNOSIS — Z794 Long term (current) use of insulin: Secondary | ICD-10-CM | POA: Diagnosis not present

## 2019-05-28 DIAGNOSIS — E782 Mixed hyperlipidemia: Secondary | ICD-10-CM | POA: Diagnosis not present

## 2019-05-28 DIAGNOSIS — M85852 Other specified disorders of bone density and structure, left thigh: Secondary | ICD-10-CM | POA: Diagnosis not present

## 2019-05-28 DIAGNOSIS — Z6839 Body mass index (BMI) 39.0-39.9, adult: Secondary | ICD-10-CM | POA: Diagnosis not present

## 2019-05-28 DIAGNOSIS — I10 Essential (primary) hypertension: Secondary | ICD-10-CM | POA: Diagnosis not present

## 2019-05-28 DIAGNOSIS — E1165 Type 2 diabetes mellitus with hyperglycemia: Secondary | ICD-10-CM | POA: Diagnosis not present

## 2019-05-28 DIAGNOSIS — G4733 Obstructive sleep apnea (adult) (pediatric): Secondary | ICD-10-CM | POA: Diagnosis not present

## 2019-06-12 DIAGNOSIS — M542 Cervicalgia: Secondary | ICD-10-CM | POA: Diagnosis not present

## 2019-07-11 DIAGNOSIS — R69 Illness, unspecified: Secondary | ICD-10-CM | POA: Diagnosis not present

## 2019-07-11 DIAGNOSIS — E1165 Type 2 diabetes mellitus with hyperglycemia: Secondary | ICD-10-CM | POA: Diagnosis not present

## 2019-07-11 DIAGNOSIS — E1159 Type 2 diabetes mellitus with other circulatory complications: Secondary | ICD-10-CM | POA: Diagnosis not present

## 2019-07-11 DIAGNOSIS — E781 Pure hyperglyceridemia: Secondary | ICD-10-CM | POA: Diagnosis not present

## 2019-07-11 DIAGNOSIS — M159 Polyosteoarthritis, unspecified: Secondary | ICD-10-CM | POA: Diagnosis not present

## 2019-07-11 DIAGNOSIS — E782 Mixed hyperlipidemia: Secondary | ICD-10-CM | POA: Diagnosis not present

## 2019-07-11 DIAGNOSIS — I10 Essential (primary) hypertension: Secondary | ICD-10-CM | POA: Diagnosis not present

## 2019-07-19 DIAGNOSIS — M62838 Other muscle spasm: Secondary | ICD-10-CM | POA: Diagnosis not present

## 2019-07-24 DIAGNOSIS — S61012A Laceration without foreign body of left thumb without damage to nail, initial encounter: Secondary | ICD-10-CM | POA: Diagnosis not present

## 2019-08-13 DIAGNOSIS — G4733 Obstructive sleep apnea (adult) (pediatric): Secondary | ICD-10-CM | POA: Diagnosis not present

## 2019-08-27 DIAGNOSIS — Z6839 Body mass index (BMI) 39.0-39.9, adult: Secondary | ICD-10-CM | POA: Diagnosis not present

## 2019-08-27 DIAGNOSIS — M2041 Other hammer toe(s) (acquired), right foot: Secondary | ICD-10-CM | POA: Diagnosis not present

## 2019-08-27 DIAGNOSIS — E1159 Type 2 diabetes mellitus with other circulatory complications: Secondary | ICD-10-CM | POA: Diagnosis not present

## 2019-08-27 DIAGNOSIS — Z0001 Encounter for general adult medical examination with abnormal findings: Secondary | ICD-10-CM | POA: Diagnosis not present

## 2019-08-27 DIAGNOSIS — E782 Mixed hyperlipidemia: Secondary | ICD-10-CM | POA: Diagnosis not present

## 2019-08-27 DIAGNOSIS — R69 Illness, unspecified: Secondary | ICD-10-CM | POA: Diagnosis not present

## 2019-08-27 DIAGNOSIS — G4733 Obstructive sleep apnea (adult) (pediatric): Secondary | ICD-10-CM | POA: Diagnosis not present

## 2019-08-27 DIAGNOSIS — E1165 Type 2 diabetes mellitus with hyperglycemia: Secondary | ICD-10-CM | POA: Diagnosis not present

## 2019-08-27 DIAGNOSIS — I10 Essential (primary) hypertension: Secondary | ICD-10-CM | POA: Diagnosis not present

## 2019-08-30 DIAGNOSIS — G4733 Obstructive sleep apnea (adult) (pediatric): Secondary | ICD-10-CM | POA: Diagnosis not present

## 2019-08-30 DIAGNOSIS — E669 Obesity, unspecified: Secondary | ICD-10-CM | POA: Diagnosis not present

## 2019-08-30 DIAGNOSIS — Z1231 Encounter for screening mammogram for malignant neoplasm of breast: Secondary | ICD-10-CM | POA: Diagnosis not present

## 2019-08-30 DIAGNOSIS — I10 Essential (primary) hypertension: Secondary | ICD-10-CM | POA: Diagnosis not present

## 2019-08-30 DIAGNOSIS — E781 Pure hyperglyceridemia: Secondary | ICD-10-CM | POA: Diagnosis not present

## 2019-08-30 DIAGNOSIS — E1165 Type 2 diabetes mellitus with hyperglycemia: Secondary | ICD-10-CM | POA: Diagnosis not present

## 2019-08-30 DIAGNOSIS — M85852 Other specified disorders of bone density and structure, left thigh: Secondary | ICD-10-CM | POA: Diagnosis not present

## 2019-08-30 DIAGNOSIS — Z0001 Encounter for general adult medical examination with abnormal findings: Secondary | ICD-10-CM | POA: Diagnosis not present

## 2019-08-30 DIAGNOSIS — R69 Illness, unspecified: Secondary | ICD-10-CM | POA: Diagnosis not present

## 2019-08-30 DIAGNOSIS — E782 Mixed hyperlipidemia: Secondary | ICD-10-CM | POA: Diagnosis not present

## 2019-09-06 DIAGNOSIS — E1165 Type 2 diabetes mellitus with hyperglycemia: Secondary | ICD-10-CM | POA: Diagnosis not present

## 2019-09-06 DIAGNOSIS — M159 Polyosteoarthritis, unspecified: Secondary | ICD-10-CM | POA: Diagnosis not present

## 2019-09-06 DIAGNOSIS — I10 Essential (primary) hypertension: Secondary | ICD-10-CM | POA: Diagnosis not present

## 2019-09-06 DIAGNOSIS — Z23 Encounter for immunization: Secondary | ICD-10-CM | POA: Diagnosis not present

## 2019-09-06 DIAGNOSIS — E782 Mixed hyperlipidemia: Secondary | ICD-10-CM | POA: Diagnosis not present

## 2019-09-06 DIAGNOSIS — E1159 Type 2 diabetes mellitus with other circulatory complications: Secondary | ICD-10-CM | POA: Diagnosis not present

## 2019-09-06 DIAGNOSIS — E781 Pure hyperglyceridemia: Secondary | ICD-10-CM | POA: Diagnosis not present

## 2019-09-06 DIAGNOSIS — R69 Illness, unspecified: Secondary | ICD-10-CM | POA: Diagnosis not present

## 2019-09-10 DIAGNOSIS — H25043 Posterior subcapsular polar age-related cataract, bilateral: Secondary | ICD-10-CM | POA: Diagnosis not present

## 2019-09-10 DIAGNOSIS — H2513 Age-related nuclear cataract, bilateral: Secondary | ICD-10-CM | POA: Diagnosis not present

## 2019-09-10 DIAGNOSIS — H25013 Cortical age-related cataract, bilateral: Secondary | ICD-10-CM | POA: Diagnosis not present

## 2019-09-10 DIAGNOSIS — H40013 Open angle with borderline findings, low risk, bilateral: Secondary | ICD-10-CM | POA: Diagnosis not present

## 2019-09-12 DIAGNOSIS — S161XXD Strain of muscle, fascia and tendon at neck level, subsequent encounter: Secondary | ICD-10-CM | POA: Diagnosis not present

## 2019-09-12 DIAGNOSIS — E139 Other specified diabetes mellitus without complications: Secondary | ICD-10-CM | POA: Diagnosis not present

## 2019-09-12 DIAGNOSIS — M799 Soft tissue disorder, unspecified: Secondary | ICD-10-CM | POA: Diagnosis not present

## 2019-09-12 DIAGNOSIS — I10 Essential (primary) hypertension: Secondary | ICD-10-CM | POA: Diagnosis not present

## 2019-09-12 DIAGNOSIS — M542 Cervicalgia: Secondary | ICD-10-CM | POA: Diagnosis not present

## 2019-09-12 DIAGNOSIS — M2560 Stiffness of unspecified joint, not elsewhere classified: Secondary | ICD-10-CM | POA: Diagnosis not present

## 2019-09-13 DIAGNOSIS — M8589 Other specified disorders of bone density and structure, multiple sites: Secondary | ICD-10-CM | POA: Diagnosis not present

## 2019-09-13 DIAGNOSIS — Z1231 Encounter for screening mammogram for malignant neoplasm of breast: Secondary | ICD-10-CM | POA: Diagnosis not present

## 2019-09-13 DIAGNOSIS — R634 Abnormal weight loss: Secondary | ICD-10-CM | POA: Diagnosis not present

## 2019-09-13 DIAGNOSIS — R29890 Loss of height: Secondary | ICD-10-CM | POA: Diagnosis not present

## 2019-09-16 DIAGNOSIS — M2560 Stiffness of unspecified joint, not elsewhere classified: Secondary | ICD-10-CM | POA: Diagnosis not present

## 2019-09-16 DIAGNOSIS — E139 Other specified diabetes mellitus without complications: Secondary | ICD-10-CM | POA: Diagnosis not present

## 2019-09-16 DIAGNOSIS — M799 Soft tissue disorder, unspecified: Secondary | ICD-10-CM | POA: Diagnosis not present

## 2019-09-16 DIAGNOSIS — S161XXD Strain of muscle, fascia and tendon at neck level, subsequent encounter: Secondary | ICD-10-CM | POA: Diagnosis not present

## 2019-09-16 DIAGNOSIS — M542 Cervicalgia: Secondary | ICD-10-CM | POA: Diagnosis not present

## 2019-09-16 DIAGNOSIS — I10 Essential (primary) hypertension: Secondary | ICD-10-CM | POA: Diagnosis not present

## 2019-09-27 DIAGNOSIS — M85852 Other specified disorders of bone density and structure, left thigh: Secondary | ICD-10-CM | POA: Diagnosis not present

## 2019-09-27 DIAGNOSIS — M85851 Other specified disorders of bone density and structure, right thigh: Secondary | ICD-10-CM | POA: Diagnosis not present

## 2019-09-30 DIAGNOSIS — R69 Illness, unspecified: Secondary | ICD-10-CM | POA: Diagnosis not present

## 2019-10-07 DIAGNOSIS — E781 Pure hyperglyceridemia: Secondary | ICD-10-CM | POA: Diagnosis not present

## 2019-10-07 DIAGNOSIS — R69 Illness, unspecified: Secondary | ICD-10-CM | POA: Diagnosis not present

## 2019-10-07 DIAGNOSIS — E1165 Type 2 diabetes mellitus with hyperglycemia: Secondary | ICD-10-CM | POA: Diagnosis not present

## 2019-10-07 DIAGNOSIS — E782 Mixed hyperlipidemia: Secondary | ICD-10-CM | POA: Diagnosis not present

## 2019-10-07 DIAGNOSIS — I10 Essential (primary) hypertension: Secondary | ICD-10-CM | POA: Diagnosis not present

## 2019-10-07 DIAGNOSIS — E1159 Type 2 diabetes mellitus with other circulatory complications: Secondary | ICD-10-CM | POA: Diagnosis not present

## 2019-10-07 DIAGNOSIS — M159 Polyosteoarthritis, unspecified: Secondary | ICD-10-CM | POA: Diagnosis not present

## 2019-10-20 DIAGNOSIS — E782 Mixed hyperlipidemia: Secondary | ICD-10-CM | POA: Diagnosis not present

## 2019-10-20 DIAGNOSIS — I10 Essential (primary) hypertension: Secondary | ICD-10-CM | POA: Diagnosis not present

## 2019-10-20 DIAGNOSIS — R69 Illness, unspecified: Secondary | ICD-10-CM | POA: Diagnosis not present

## 2019-10-20 DIAGNOSIS — E1165 Type 2 diabetes mellitus with hyperglycemia: Secondary | ICD-10-CM | POA: Diagnosis not present

## 2019-10-20 DIAGNOSIS — E1159 Type 2 diabetes mellitus with other circulatory complications: Secondary | ICD-10-CM | POA: Diagnosis not present

## 2019-10-20 DIAGNOSIS — M159 Polyosteoarthritis, unspecified: Secondary | ICD-10-CM | POA: Diagnosis not present

## 2019-10-20 DIAGNOSIS — E781 Pure hyperglyceridemia: Secondary | ICD-10-CM | POA: Diagnosis not present

## 2019-11-30 DIAGNOSIS — Z20828 Contact with and (suspected) exposure to other viral communicable diseases: Secondary | ICD-10-CM | POA: Diagnosis not present

## 2019-12-07 DIAGNOSIS — S2232XA Fracture of one rib, left side, initial encounter for closed fracture: Secondary | ICD-10-CM | POA: Diagnosis not present

## 2019-12-07 DIAGNOSIS — R0781 Pleurodynia: Secondary | ICD-10-CM | POA: Diagnosis not present

## 2019-12-07 DIAGNOSIS — S66912A Strain of unspecified muscle, fascia and tendon at wrist and hand level, left hand, initial encounter: Secondary | ICD-10-CM | POA: Diagnosis not present

## 2019-12-07 DIAGNOSIS — W19XXXA Unspecified fall, initial encounter: Secondary | ICD-10-CM | POA: Diagnosis not present

## 2019-12-26 DIAGNOSIS — E781 Pure hyperglyceridemia: Secondary | ICD-10-CM | POA: Diagnosis not present

## 2019-12-26 DIAGNOSIS — E1159 Type 2 diabetes mellitus with other circulatory complications: Secondary | ICD-10-CM | POA: Diagnosis not present

## 2019-12-26 DIAGNOSIS — I10 Essential (primary) hypertension: Secondary | ICD-10-CM | POA: Diagnosis not present

## 2019-12-26 DIAGNOSIS — E1165 Type 2 diabetes mellitus with hyperglycemia: Secondary | ICD-10-CM | POA: Diagnosis not present

## 2019-12-26 DIAGNOSIS — R69 Illness, unspecified: Secondary | ICD-10-CM | POA: Diagnosis not present

## 2019-12-26 DIAGNOSIS — E782 Mixed hyperlipidemia: Secondary | ICD-10-CM | POA: Diagnosis not present

## 2019-12-26 DIAGNOSIS — M159 Polyosteoarthritis, unspecified: Secondary | ICD-10-CM | POA: Diagnosis not present

## 2020-01-20 DIAGNOSIS — I10 Essential (primary) hypertension: Secondary | ICD-10-CM | POA: Diagnosis not present

## 2020-01-20 DIAGNOSIS — E781 Pure hyperglyceridemia: Secondary | ICD-10-CM | POA: Diagnosis not present

## 2020-01-20 DIAGNOSIS — E1165 Type 2 diabetes mellitus with hyperglycemia: Secondary | ICD-10-CM | POA: Diagnosis not present

## 2020-01-20 DIAGNOSIS — E1159 Type 2 diabetes mellitus with other circulatory complications: Secondary | ICD-10-CM | POA: Diagnosis not present

## 2020-01-20 DIAGNOSIS — M159 Polyosteoarthritis, unspecified: Secondary | ICD-10-CM | POA: Diagnosis not present

## 2020-01-20 DIAGNOSIS — R69 Illness, unspecified: Secondary | ICD-10-CM | POA: Diagnosis not present

## 2020-01-20 DIAGNOSIS — E782 Mixed hyperlipidemia: Secondary | ICD-10-CM | POA: Diagnosis not present

## 2020-02-01 DIAGNOSIS — I10 Essential (primary) hypertension: Secondary | ICD-10-CM | POA: Diagnosis not present

## 2020-02-01 DIAGNOSIS — G8929 Other chronic pain: Secondary | ICD-10-CM | POA: Diagnosis not present

## 2020-02-01 DIAGNOSIS — Z794 Long term (current) use of insulin: Secondary | ICD-10-CM | POA: Diagnosis not present

## 2020-02-01 DIAGNOSIS — K219 Gastro-esophageal reflux disease without esophagitis: Secondary | ICD-10-CM | POA: Diagnosis not present

## 2020-02-01 DIAGNOSIS — R69 Illness, unspecified: Secondary | ICD-10-CM | POA: Diagnosis not present

## 2020-02-01 DIAGNOSIS — E785 Hyperlipidemia, unspecified: Secondary | ICD-10-CM | POA: Diagnosis not present

## 2020-02-01 DIAGNOSIS — G4730 Sleep apnea, unspecified: Secondary | ICD-10-CM | POA: Diagnosis not present

## 2020-02-01 DIAGNOSIS — F419 Anxiety disorder, unspecified: Secondary | ICD-10-CM | POA: Diagnosis not present

## 2020-02-01 DIAGNOSIS — E1165 Type 2 diabetes mellitus with hyperglycemia: Secondary | ICD-10-CM | POA: Diagnosis not present

## 2020-02-12 DIAGNOSIS — M85852 Other specified disorders of bone density and structure, left thigh: Secondary | ICD-10-CM | POA: Diagnosis not present

## 2020-02-12 DIAGNOSIS — E781 Pure hyperglyceridemia: Secondary | ICD-10-CM | POA: Diagnosis not present

## 2020-02-12 DIAGNOSIS — E669 Obesity, unspecified: Secondary | ICD-10-CM | POA: Diagnosis not present

## 2020-02-12 DIAGNOSIS — G4733 Obstructive sleep apnea (adult) (pediatric): Secondary | ICD-10-CM | POA: Diagnosis not present

## 2020-02-12 DIAGNOSIS — E1165 Type 2 diabetes mellitus with hyperglycemia: Secondary | ICD-10-CM | POA: Diagnosis not present

## 2020-02-12 DIAGNOSIS — Z0001 Encounter for general adult medical examination with abnormal findings: Secondary | ICD-10-CM | POA: Diagnosis not present

## 2020-02-12 DIAGNOSIS — E782 Mixed hyperlipidemia: Secondary | ICD-10-CM | POA: Diagnosis not present

## 2020-02-12 DIAGNOSIS — R69 Illness, unspecified: Secondary | ICD-10-CM | POA: Diagnosis not present

## 2020-02-12 DIAGNOSIS — I10 Essential (primary) hypertension: Secondary | ICD-10-CM | POA: Diagnosis not present

## 2020-02-12 DIAGNOSIS — Z1211 Encounter for screening for malignant neoplasm of colon: Secondary | ICD-10-CM | POA: Diagnosis not present

## 2020-02-21 DIAGNOSIS — M65331 Trigger finger, right middle finger: Secondary | ICD-10-CM | POA: Diagnosis not present

## 2020-02-21 DIAGNOSIS — R69 Illness, unspecified: Secondary | ICD-10-CM | POA: Diagnosis not present

## 2020-02-21 DIAGNOSIS — K219 Gastro-esophageal reflux disease without esophagitis: Secondary | ICD-10-CM | POA: Diagnosis not present

## 2020-02-21 DIAGNOSIS — I10 Essential (primary) hypertension: Secondary | ICD-10-CM | POA: Diagnosis not present

## 2020-02-21 DIAGNOSIS — E1165 Type 2 diabetes mellitus with hyperglycemia: Secondary | ICD-10-CM | POA: Diagnosis not present

## 2020-02-21 DIAGNOSIS — E782 Mixed hyperlipidemia: Secondary | ICD-10-CM | POA: Diagnosis not present

## 2020-02-21 DIAGNOSIS — E781 Pure hyperglyceridemia: Secondary | ICD-10-CM | POA: Diagnosis not present

## 2020-02-21 DIAGNOSIS — E669 Obesity, unspecified: Secondary | ICD-10-CM | POA: Diagnosis not present

## 2020-02-24 DIAGNOSIS — R69 Illness, unspecified: Secondary | ICD-10-CM | POA: Diagnosis not present

## 2020-02-28 DIAGNOSIS — G4733 Obstructive sleep apnea (adult) (pediatric): Secondary | ICD-10-CM | POA: Diagnosis not present

## 2020-03-03 DIAGNOSIS — R195 Other fecal abnormalities: Secondary | ICD-10-CM | POA: Diagnosis not present

## 2020-03-11 DIAGNOSIS — I10 Essential (primary) hypertension: Secondary | ICD-10-CM | POA: Diagnosis not present

## 2020-03-11 DIAGNOSIS — E781 Pure hyperglyceridemia: Secondary | ICD-10-CM | POA: Diagnosis not present

## 2020-03-11 DIAGNOSIS — M159 Polyosteoarthritis, unspecified: Secondary | ICD-10-CM | POA: Diagnosis not present

## 2020-03-11 DIAGNOSIS — R69 Illness, unspecified: Secondary | ICD-10-CM | POA: Diagnosis not present

## 2020-03-11 DIAGNOSIS — E1159 Type 2 diabetes mellitus with other circulatory complications: Secondary | ICD-10-CM | POA: Diagnosis not present

## 2020-03-11 DIAGNOSIS — E782 Mixed hyperlipidemia: Secondary | ICD-10-CM | POA: Diagnosis not present

## 2020-03-11 DIAGNOSIS — E1165 Type 2 diabetes mellitus with hyperglycemia: Secondary | ICD-10-CM | POA: Diagnosis not present

## 2020-03-24 DIAGNOSIS — E782 Mixed hyperlipidemia: Secondary | ICD-10-CM | POA: Diagnosis not present

## 2020-03-24 DIAGNOSIS — R69 Illness, unspecified: Secondary | ICD-10-CM | POA: Diagnosis not present

## 2020-03-24 DIAGNOSIS — E781 Pure hyperglyceridemia: Secondary | ICD-10-CM | POA: Diagnosis not present

## 2020-03-24 DIAGNOSIS — I10 Essential (primary) hypertension: Secondary | ICD-10-CM | POA: Diagnosis not present

## 2020-03-24 DIAGNOSIS — E1165 Type 2 diabetes mellitus with hyperglycemia: Secondary | ICD-10-CM | POA: Diagnosis not present

## 2020-03-24 DIAGNOSIS — E1159 Type 2 diabetes mellitus with other circulatory complications: Secondary | ICD-10-CM | POA: Diagnosis not present

## 2020-03-24 DIAGNOSIS — M159 Polyosteoarthritis, unspecified: Secondary | ICD-10-CM | POA: Diagnosis not present

## 2020-04-20 DIAGNOSIS — R69 Illness, unspecified: Secondary | ICD-10-CM | POA: Diagnosis not present

## 2020-04-30 DIAGNOSIS — D120 Benign neoplasm of cecum: Secondary | ICD-10-CM | POA: Diagnosis not present

## 2020-04-30 DIAGNOSIS — D122 Benign neoplasm of ascending colon: Secondary | ICD-10-CM | POA: Diagnosis not present

## 2020-04-30 DIAGNOSIS — Z1159 Encounter for screening for other viral diseases: Secondary | ICD-10-CM | POA: Diagnosis not present

## 2020-04-30 DIAGNOSIS — D124 Benign neoplasm of descending colon: Secondary | ICD-10-CM | POA: Diagnosis not present

## 2020-04-30 DIAGNOSIS — R195 Other fecal abnormalities: Secondary | ICD-10-CM | POA: Diagnosis not present

## 2020-04-30 DIAGNOSIS — D123 Benign neoplasm of transverse colon: Secondary | ICD-10-CM | POA: Diagnosis not present

## 2020-05-05 DIAGNOSIS — D120 Benign neoplasm of cecum: Secondary | ICD-10-CM | POA: Diagnosis not present

## 2020-05-05 DIAGNOSIS — D123 Benign neoplasm of transverse colon: Secondary | ICD-10-CM | POA: Diagnosis not present

## 2020-05-05 DIAGNOSIS — D124 Benign neoplasm of descending colon: Secondary | ICD-10-CM | POA: Diagnosis not present

## 2020-05-05 DIAGNOSIS — R195 Other fecal abnormalities: Secondary | ICD-10-CM | POA: Diagnosis not present

## 2020-05-05 DIAGNOSIS — D122 Benign neoplasm of ascending colon: Secondary | ICD-10-CM | POA: Diagnosis not present

## 2020-05-08 DIAGNOSIS — D120 Benign neoplasm of cecum: Secondary | ICD-10-CM | POA: Diagnosis not present

## 2020-05-08 DIAGNOSIS — D124 Benign neoplasm of descending colon: Secondary | ICD-10-CM | POA: Diagnosis not present

## 2020-05-08 DIAGNOSIS — D122 Benign neoplasm of ascending colon: Secondary | ICD-10-CM | POA: Diagnosis not present

## 2020-05-08 DIAGNOSIS — D123 Benign neoplasm of transverse colon: Secondary | ICD-10-CM | POA: Diagnosis not present

## 2020-05-27 DIAGNOSIS — R69 Illness, unspecified: Secondary | ICD-10-CM | POA: Diagnosis not present

## 2020-05-29 DIAGNOSIS — G4733 Obstructive sleep apnea (adult) (pediatric): Secondary | ICD-10-CM | POA: Diagnosis not present

## 2020-06-01 DIAGNOSIS — R69 Illness, unspecified: Secondary | ICD-10-CM | POA: Diagnosis not present

## 2020-06-10 DIAGNOSIS — E781 Pure hyperglyceridemia: Secondary | ICD-10-CM | POA: Diagnosis not present

## 2020-06-10 DIAGNOSIS — E1165 Type 2 diabetes mellitus with hyperglycemia: Secondary | ICD-10-CM | POA: Diagnosis not present

## 2020-06-10 DIAGNOSIS — I10 Essential (primary) hypertension: Secondary | ICD-10-CM | POA: Diagnosis not present

## 2020-06-10 DIAGNOSIS — E782 Mixed hyperlipidemia: Secondary | ICD-10-CM | POA: Diagnosis not present

## 2020-06-10 DIAGNOSIS — M159 Polyosteoarthritis, unspecified: Secondary | ICD-10-CM | POA: Diagnosis not present

## 2020-06-10 DIAGNOSIS — R69 Illness, unspecified: Secondary | ICD-10-CM | POA: Diagnosis not present

## 2020-06-10 DIAGNOSIS — E1159 Type 2 diabetes mellitus with other circulatory complications: Secondary | ICD-10-CM | POA: Diagnosis not present

## 2020-06-17 DIAGNOSIS — R69 Illness, unspecified: Secondary | ICD-10-CM | POA: Diagnosis not present

## 2020-06-17 DIAGNOSIS — E781 Pure hyperglyceridemia: Secondary | ICD-10-CM | POA: Diagnosis not present

## 2020-06-17 DIAGNOSIS — M159 Polyosteoarthritis, unspecified: Secondary | ICD-10-CM | POA: Diagnosis not present

## 2020-06-17 DIAGNOSIS — E1165 Type 2 diabetes mellitus with hyperglycemia: Secondary | ICD-10-CM | POA: Diagnosis not present

## 2020-06-17 DIAGNOSIS — E1159 Type 2 diabetes mellitus with other circulatory complications: Secondary | ICD-10-CM | POA: Diagnosis not present

## 2020-06-17 DIAGNOSIS — E782 Mixed hyperlipidemia: Secondary | ICD-10-CM | POA: Diagnosis not present

## 2020-06-17 DIAGNOSIS — I10 Essential (primary) hypertension: Secondary | ICD-10-CM | POA: Diagnosis not present

## 2020-06-28 DIAGNOSIS — G4733 Obstructive sleep apnea (adult) (pediatric): Secondary | ICD-10-CM | POA: Diagnosis not present

## 2020-06-29 DIAGNOSIS — R69 Illness, unspecified: Secondary | ICD-10-CM | POA: Diagnosis not present

## 2020-07-13 DIAGNOSIS — E782 Mixed hyperlipidemia: Secondary | ICD-10-CM | POA: Diagnosis not present

## 2020-07-13 DIAGNOSIS — E1159 Type 2 diabetes mellitus with other circulatory complications: Secondary | ICD-10-CM | POA: Diagnosis not present

## 2020-07-13 DIAGNOSIS — I10 Essential (primary) hypertension: Secondary | ICD-10-CM | POA: Diagnosis not present

## 2020-07-13 DIAGNOSIS — E1165 Type 2 diabetes mellitus with hyperglycemia: Secondary | ICD-10-CM | POA: Diagnosis not present

## 2020-07-13 DIAGNOSIS — M159 Polyosteoarthritis, unspecified: Secondary | ICD-10-CM | POA: Diagnosis not present

## 2020-07-13 DIAGNOSIS — R69 Illness, unspecified: Secondary | ICD-10-CM | POA: Diagnosis not present

## 2020-07-13 DIAGNOSIS — E781 Pure hyperglyceridemia: Secondary | ICD-10-CM | POA: Diagnosis not present

## 2020-07-20 DIAGNOSIS — M65332 Trigger finger, left middle finger: Secondary | ICD-10-CM | POA: Diagnosis not present

## 2020-07-20 DIAGNOSIS — M1812 Unilateral primary osteoarthritis of first carpometacarpal joint, left hand: Secondary | ICD-10-CM | POA: Diagnosis not present

## 2020-07-20 DIAGNOSIS — M65331 Trigger finger, right middle finger: Secondary | ICD-10-CM | POA: Diagnosis not present

## 2020-07-20 DIAGNOSIS — M79642 Pain in left hand: Secondary | ICD-10-CM | POA: Insufficient documentation

## 2020-07-29 DIAGNOSIS — G4733 Obstructive sleep apnea (adult) (pediatric): Secondary | ICD-10-CM | POA: Diagnosis not present

## 2020-08-05 ENCOUNTER — Ambulatory Visit (INDEPENDENT_AMBULATORY_CARE_PROVIDER_SITE_OTHER): Payer: 59 | Admitting: Podiatry

## 2020-08-05 ENCOUNTER — Ambulatory Visit (INDEPENDENT_AMBULATORY_CARE_PROVIDER_SITE_OTHER): Payer: 59

## 2020-08-05 ENCOUNTER — Other Ambulatory Visit: Payer: Self-pay

## 2020-08-05 DIAGNOSIS — M2041 Other hammer toe(s) (acquired), right foot: Secondary | ICD-10-CM

## 2020-08-05 DIAGNOSIS — S99921A Unspecified injury of right foot, initial encounter: Secondary | ICD-10-CM | POA: Diagnosis not present

## 2020-08-05 DIAGNOSIS — M19079 Primary osteoarthritis, unspecified ankle and foot: Secondary | ICD-10-CM
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
## 2020-08-07 DIAGNOSIS — M1812 Unilateral primary osteoarthritis of first carpometacarpal joint, left hand: Secondary | ICD-10-CM | POA: Diagnosis not present

## 2020-08-07 DIAGNOSIS — M65332 Trigger finger, left middle finger: Secondary | ICD-10-CM | POA: Diagnosis not present

## 2020-08-07 DIAGNOSIS — M65331 Trigger finger, right middle finger: Secondary | ICD-10-CM | POA: Diagnosis not present

## 2020-08-14 DIAGNOSIS — E781 Pure hyperglyceridemia: Secondary | ICD-10-CM | POA: Diagnosis not present

## 2020-08-14 DIAGNOSIS — I10 Essential (primary) hypertension: Secondary | ICD-10-CM | POA: Diagnosis not present

## 2020-08-14 DIAGNOSIS — E1159 Type 2 diabetes mellitus with other circulatory complications: Secondary | ICD-10-CM | POA: Diagnosis not present

## 2020-08-14 DIAGNOSIS — M159 Polyosteoarthritis, unspecified: Secondary | ICD-10-CM | POA: Diagnosis not present

## 2020-08-14 DIAGNOSIS — E782 Mixed hyperlipidemia: Secondary | ICD-10-CM | POA: Diagnosis not present

## 2020-08-14 DIAGNOSIS — E1165 Type 2 diabetes mellitus with hyperglycemia: Secondary | ICD-10-CM | POA: Diagnosis not present

## 2020-08-14 DIAGNOSIS — R69 Illness, unspecified: Secondary | ICD-10-CM | POA: Diagnosis not present

## 2020-08-31 DIAGNOSIS — Z0001 Encounter for general adult medical examination with abnormal findings: Secondary | ICD-10-CM | POA: Diagnosis not present

## 2020-08-31 DIAGNOSIS — G4733 Obstructive sleep apnea (adult) (pediatric): Secondary | ICD-10-CM | POA: Diagnosis not present

## 2020-08-31 DIAGNOSIS — E1165 Type 2 diabetes mellitus with hyperglycemia: Secondary | ICD-10-CM | POA: Diagnosis not present

## 2020-08-31 DIAGNOSIS — M159 Polyosteoarthritis, unspecified: Secondary | ICD-10-CM | POA: Diagnosis not present

## 2020-08-31 DIAGNOSIS — I10 Essential (primary) hypertension: Secondary | ICD-10-CM | POA: Diagnosis not present

## 2020-08-31 DIAGNOSIS — E782 Mixed hyperlipidemia: Secondary | ICD-10-CM | POA: Diagnosis not present

## 2020-08-31 DIAGNOSIS — R69 Illness, unspecified: Secondary | ICD-10-CM | POA: Diagnosis not present

## 2020-08-31 DIAGNOSIS — M85852 Other specified disorders of bone density and structure, left thigh: Secondary | ICD-10-CM | POA: Diagnosis not present

## 2020-08-31 DIAGNOSIS — U071 COVID-19: Secondary | ICD-10-CM | POA: Diagnosis not present

## 2020-08-31 DIAGNOSIS — E781 Pure hyperglyceridemia: Secondary | ICD-10-CM | POA: Diagnosis not present

## 2020-09-04 DIAGNOSIS — R69 Illness, unspecified: Secondary | ICD-10-CM | POA: Diagnosis not present

## 2020-09-04 DIAGNOSIS — I10 Essential (primary) hypertension: Secondary | ICD-10-CM | POA: Diagnosis not present

## 2020-09-04 DIAGNOSIS — M159 Polyosteoarthritis, unspecified: Secondary | ICD-10-CM | POA: Diagnosis not present

## 2020-09-04 DIAGNOSIS — E782 Mixed hyperlipidemia: Secondary | ICD-10-CM | POA: Diagnosis not present

## 2020-09-04 DIAGNOSIS — G4733 Obstructive sleep apnea (adult) (pediatric): Secondary | ICD-10-CM | POA: Diagnosis not present

## 2020-09-04 DIAGNOSIS — E781 Pure hyperglyceridemia: Secondary | ICD-10-CM | POA: Diagnosis not present

## 2020-09-04 DIAGNOSIS — E1159 Type 2 diabetes mellitus with other circulatory complications: Secondary | ICD-10-CM | POA: Diagnosis not present

## 2020-09-04 DIAGNOSIS — Z23 Encounter for immunization: Secondary | ICD-10-CM | POA: Diagnosis not present

## 2020-09-04 DIAGNOSIS — K219 Gastro-esophageal reflux disease without esophagitis: Secondary | ICD-10-CM | POA: Diagnosis not present

## 2020-09-07 DIAGNOSIS — M65332 Trigger finger, left middle finger: Secondary | ICD-10-CM | POA: Diagnosis not present

## 2020-09-07 DIAGNOSIS — M65331 Trigger finger, right middle finger: Secondary | ICD-10-CM | POA: Diagnosis not present

## 2020-09-15 DIAGNOSIS — H40013 Open angle with borderline findings, low risk, bilateral: Secondary | ICD-10-CM | POA: Diagnosis not present

## 2020-09-15 DIAGNOSIS — H25043 Posterior subcapsular polar age-related cataract, bilateral: Secondary | ICD-10-CM | POA: Diagnosis not present

## 2020-09-15 DIAGNOSIS — Z0100 Encounter for examination of eyes and vision without abnormal findings: Secondary | ICD-10-CM | POA: Diagnosis not present

## 2020-09-15 DIAGNOSIS — H2513 Age-related nuclear cataract, bilateral: Secondary | ICD-10-CM | POA: Diagnosis not present

## 2020-09-15 DIAGNOSIS — H25013 Cortical age-related cataract, bilateral: Secondary | ICD-10-CM | POA: Diagnosis not present

## 2020-09-18 DIAGNOSIS — Z0100 Encounter for examination of eyes and vision without abnormal findings: Secondary | ICD-10-CM | POA: Diagnosis not present

## 2020-10-02 DIAGNOSIS — M159 Polyosteoarthritis, unspecified: Secondary | ICD-10-CM | POA: Diagnosis not present

## 2020-10-02 DIAGNOSIS — I10 Essential (primary) hypertension: Secondary | ICD-10-CM | POA: Diagnosis not present

## 2020-10-02 DIAGNOSIS — E1165 Type 2 diabetes mellitus with hyperglycemia: Secondary | ICD-10-CM | POA: Diagnosis not present

## 2020-10-02 DIAGNOSIS — E782 Mixed hyperlipidemia: Secondary | ICD-10-CM | POA: Diagnosis not present

## 2020-10-02 DIAGNOSIS — E1159 Type 2 diabetes mellitus with other circulatory complications: Secondary | ICD-10-CM | POA: Diagnosis not present

## 2020-10-02 DIAGNOSIS — E781 Pure hyperglyceridemia: Secondary | ICD-10-CM | POA: Diagnosis not present

## 2020-10-02 DIAGNOSIS — R69 Illness, unspecified: Secondary | ICD-10-CM | POA: Diagnosis not present

## 2020-10-08 ENCOUNTER — Ambulatory Visit: Payer: 59 | Admitting: Podiatry

## 2020-10-30 DIAGNOSIS — G4733 Obstructive sleep apnea (adult) (pediatric): Secondary | ICD-10-CM | POA: Diagnosis not present

## 2020-11-02 DIAGNOSIS — Z0000 Encounter for general adult medical examination without abnormal findings: Secondary | ICD-10-CM | POA: Diagnosis not present

## 2020-11-02 DIAGNOSIS — Z1389 Encounter for screening for other disorder: Secondary | ICD-10-CM | POA: Diagnosis not present

## 2020-11-03 DIAGNOSIS — R69 Illness, unspecified: Secondary | ICD-10-CM | POA: Diagnosis not present

## 2020-11-03 DIAGNOSIS — M159 Polyosteoarthritis, unspecified: Secondary | ICD-10-CM | POA: Diagnosis not present

## 2020-11-03 DIAGNOSIS — K219 Gastro-esophageal reflux disease without esophagitis: Secondary | ICD-10-CM | POA: Diagnosis not present

## 2020-11-03 DIAGNOSIS — E781 Pure hyperglyceridemia: Secondary | ICD-10-CM | POA: Diagnosis not present

## 2020-11-03 DIAGNOSIS — E1165 Type 2 diabetes mellitus with hyperglycemia: Secondary | ICD-10-CM | POA: Diagnosis not present

## 2020-11-03 DIAGNOSIS — E1159 Type 2 diabetes mellitus with other circulatory complications: Secondary | ICD-10-CM | POA: Diagnosis not present

## 2020-11-03 DIAGNOSIS — I10 Essential (primary) hypertension: Secondary | ICD-10-CM | POA: Diagnosis not present

## 2020-11-03 DIAGNOSIS — E782 Mixed hyperlipidemia: Secondary | ICD-10-CM | POA: Diagnosis not present

## 2020-12-02 DIAGNOSIS — Z1231 Encounter for screening mammogram for malignant neoplasm of breast: Secondary | ICD-10-CM | POA: Diagnosis not present

## 2020-12-08 DIAGNOSIS — K219 Gastro-esophageal reflux disease without esophagitis: Secondary | ICD-10-CM | POA: Diagnosis not present

## 2020-12-08 DIAGNOSIS — R69 Illness, unspecified: Secondary | ICD-10-CM | POA: Diagnosis not present

## 2020-12-08 DIAGNOSIS — M159 Polyosteoarthritis, unspecified: Secondary | ICD-10-CM | POA: Diagnosis not present

## 2020-12-08 DIAGNOSIS — E1159 Type 2 diabetes mellitus with other circulatory complications: Secondary | ICD-10-CM | POA: Diagnosis not present

## 2020-12-08 DIAGNOSIS — E1165 Type 2 diabetes mellitus with hyperglycemia: Secondary | ICD-10-CM | POA: Diagnosis not present

## 2020-12-08 DIAGNOSIS — E781 Pure hyperglyceridemia: Secondary | ICD-10-CM | POA: Diagnosis not present

## 2020-12-08 DIAGNOSIS — I10 Essential (primary) hypertension: Secondary | ICD-10-CM | POA: Diagnosis not present

## 2020-12-08 DIAGNOSIS — E782 Mixed hyperlipidemia: Secondary | ICD-10-CM | POA: Diagnosis not present

## 2020-12-23 DIAGNOSIS — G4733 Obstructive sleep apnea (adult) (pediatric): Secondary | ICD-10-CM | POA: Diagnosis not present

## 2020-12-23 DIAGNOSIS — Z6841 Body Mass Index (BMI) 40.0 and over, adult: Secondary | ICD-10-CM | POA: Diagnosis not present

## 2020-12-23 DIAGNOSIS — I10 Essential (primary) hypertension: Secondary | ICD-10-CM | POA: Diagnosis not present

## 2020-12-23 DIAGNOSIS — E785 Hyperlipidemia, unspecified: Secondary | ICD-10-CM | POA: Diagnosis not present

## 2020-12-23 DIAGNOSIS — E1165 Type 2 diabetes mellitus with hyperglycemia: Secondary | ICD-10-CM | POA: Diagnosis not present

## 2020-12-23 DIAGNOSIS — F419 Anxiety disorder, unspecified: Secondary | ICD-10-CM | POA: Diagnosis not present

## 2020-12-23 DIAGNOSIS — R69 Illness, unspecified: Secondary | ICD-10-CM | POA: Diagnosis not present

## 2020-12-23 DIAGNOSIS — F429 Obsessive-compulsive disorder, unspecified: Secondary | ICD-10-CM | POA: Diagnosis not present

## 2020-12-23 DIAGNOSIS — Z794 Long term (current) use of insulin: Secondary | ICD-10-CM | POA: Diagnosis not present

## 2021-01-07 DIAGNOSIS — E781 Pure hyperglyceridemia: Secondary | ICD-10-CM | POA: Diagnosis not present

## 2021-01-07 DIAGNOSIS — I10 Essential (primary) hypertension: Secondary | ICD-10-CM | POA: Diagnosis not present

## 2021-01-07 DIAGNOSIS — E1165 Type 2 diabetes mellitus with hyperglycemia: Secondary | ICD-10-CM | POA: Diagnosis not present

## 2021-01-07 DIAGNOSIS — E782 Mixed hyperlipidemia: Secondary | ICD-10-CM | POA: Diagnosis not present

## 2021-01-07 DIAGNOSIS — M159 Polyosteoarthritis, unspecified: Secondary | ICD-10-CM | POA: Diagnosis not present

## 2021-01-07 DIAGNOSIS — R69 Illness, unspecified: Secondary | ICD-10-CM | POA: Diagnosis not present

## 2021-01-07 DIAGNOSIS — K219 Gastro-esophageal reflux disease without esophagitis: Secondary | ICD-10-CM | POA: Diagnosis not present

## 2021-01-07 DIAGNOSIS — E1159 Type 2 diabetes mellitus with other circulatory complications: Secondary | ICD-10-CM | POA: Diagnosis not present

## 2021-02-26 DIAGNOSIS — E782 Mixed hyperlipidemia: Secondary | ICD-10-CM | POA: Diagnosis not present

## 2021-02-26 DIAGNOSIS — E1165 Type 2 diabetes mellitus with hyperglycemia: Secondary | ICD-10-CM | POA: Diagnosis not present

## 2021-02-26 DIAGNOSIS — Z7984 Long term (current) use of oral hypoglycemic drugs: Secondary | ICD-10-CM | POA: Diagnosis not present

## 2021-03-04 DIAGNOSIS — K219 Gastro-esophageal reflux disease without esophagitis: Secondary | ICD-10-CM | POA: Diagnosis not present

## 2021-03-04 DIAGNOSIS — M85852 Other specified disorders of bone density and structure, left thigh: Secondary | ICD-10-CM | POA: Diagnosis not present

## 2021-03-04 DIAGNOSIS — E781 Pure hyperglyceridemia: Secondary | ICD-10-CM | POA: Diagnosis not present

## 2021-03-04 DIAGNOSIS — E1159 Type 2 diabetes mellitus with other circulatory complications: Secondary | ICD-10-CM | POA: Diagnosis not present

## 2021-03-04 DIAGNOSIS — R69 Illness, unspecified: Secondary | ICD-10-CM | POA: Diagnosis not present

## 2021-03-04 DIAGNOSIS — M159 Polyosteoarthritis, unspecified: Secondary | ICD-10-CM | POA: Diagnosis not present

## 2021-03-04 DIAGNOSIS — E782 Mixed hyperlipidemia: Secondary | ICD-10-CM | POA: Diagnosis not present

## 2021-03-10 DIAGNOSIS — E782 Mixed hyperlipidemia: Secondary | ICD-10-CM | POA: Diagnosis not present

## 2021-03-10 DIAGNOSIS — E1165 Type 2 diabetes mellitus with hyperglycemia: Secondary | ICD-10-CM | POA: Diagnosis not present

## 2021-03-10 DIAGNOSIS — R69 Illness, unspecified: Secondary | ICD-10-CM | POA: Diagnosis not present

## 2021-03-10 DIAGNOSIS — K219 Gastro-esophageal reflux disease without esophagitis: Secondary | ICD-10-CM | POA: Diagnosis not present

## 2021-03-10 DIAGNOSIS — E1159 Type 2 diabetes mellitus with other circulatory complications: Secondary | ICD-10-CM | POA: Diagnosis not present

## 2021-03-10 DIAGNOSIS — E781 Pure hyperglyceridemia: Secondary | ICD-10-CM | POA: Diagnosis not present

## 2021-03-10 DIAGNOSIS — I10 Essential (primary) hypertension: Secondary | ICD-10-CM | POA: Diagnosis not present

## 2021-03-10 DIAGNOSIS — M159 Polyosteoarthritis, unspecified: Secondary | ICD-10-CM | POA: Diagnosis not present

## 2021-03-18 DIAGNOSIS — E782 Mixed hyperlipidemia: Secondary | ICD-10-CM | POA: Diagnosis not present

## 2021-03-18 DIAGNOSIS — K219 Gastro-esophageal reflux disease without esophagitis: Secondary | ICD-10-CM | POA: Diagnosis not present

## 2021-03-18 DIAGNOSIS — R69 Illness, unspecified: Secondary | ICD-10-CM | POA: Diagnosis not present

## 2021-03-18 DIAGNOSIS — E1159 Type 2 diabetes mellitus with other circulatory complications: Secondary | ICD-10-CM | POA: Diagnosis not present

## 2021-03-18 DIAGNOSIS — M159 Polyosteoarthritis, unspecified: Secondary | ICD-10-CM | POA: Diagnosis not present

## 2021-03-18 DIAGNOSIS — I10 Essential (primary) hypertension: Secondary | ICD-10-CM | POA: Diagnosis not present

## 2021-03-18 DIAGNOSIS — E781 Pure hyperglyceridemia: Secondary | ICD-10-CM | POA: Diagnosis not present

## 2021-03-18 DIAGNOSIS — E1165 Type 2 diabetes mellitus with hyperglycemia: Secondary | ICD-10-CM | POA: Diagnosis not present

## 2021-05-07 DIAGNOSIS — I10 Essential (primary) hypertension: Secondary | ICD-10-CM | POA: Diagnosis not present

## 2021-05-07 DIAGNOSIS — E1159 Type 2 diabetes mellitus with other circulatory complications: Secondary | ICD-10-CM | POA: Diagnosis not present

## 2021-05-07 DIAGNOSIS — E1165 Type 2 diabetes mellitus with hyperglycemia: Secondary | ICD-10-CM | POA: Diagnosis not present

## 2021-05-07 DIAGNOSIS — R69 Illness, unspecified: Secondary | ICD-10-CM | POA: Diagnosis not present

## 2021-05-07 DIAGNOSIS — K219 Gastro-esophageal reflux disease without esophagitis: Secondary | ICD-10-CM | POA: Diagnosis not present

## 2021-05-07 DIAGNOSIS — E782 Mixed hyperlipidemia: Secondary | ICD-10-CM | POA: Diagnosis not present

## 2021-05-07 DIAGNOSIS — M159 Polyosteoarthritis, unspecified: Secondary | ICD-10-CM | POA: Diagnosis not present

## 2021-05-07 DIAGNOSIS — E781 Pure hyperglyceridemia: Secondary | ICD-10-CM | POA: Diagnosis not present

## 2021-06-07 DIAGNOSIS — E781 Pure hyperglyceridemia: Secondary | ICD-10-CM | POA: Diagnosis not present

## 2021-06-07 DIAGNOSIS — M159 Polyosteoarthritis, unspecified: Secondary | ICD-10-CM | POA: Diagnosis not present

## 2021-06-07 DIAGNOSIS — I10 Essential (primary) hypertension: Secondary | ICD-10-CM | POA: Diagnosis not present

## 2021-06-07 DIAGNOSIS — M85852 Other specified disorders of bone density and structure, left thigh: Secondary | ICD-10-CM | POA: Diagnosis not present

## 2021-06-07 DIAGNOSIS — E782 Mixed hyperlipidemia: Secondary | ICD-10-CM | POA: Diagnosis not present

## 2021-06-07 DIAGNOSIS — E1165 Type 2 diabetes mellitus with hyperglycemia: Secondary | ICD-10-CM | POA: Diagnosis not present

## 2021-06-07 DIAGNOSIS — R69 Illness, unspecified: Secondary | ICD-10-CM | POA: Diagnosis not present

## 2021-06-07 DIAGNOSIS — K219 Gastro-esophageal reflux disease without esophagitis: Secondary | ICD-10-CM | POA: Diagnosis not present

## 2021-06-07 DIAGNOSIS — E1159 Type 2 diabetes mellitus with other circulatory complications: Secondary | ICD-10-CM | POA: Diagnosis not present

## 2021-06-10 DIAGNOSIS — I10 Essential (primary) hypertension: Secondary | ICD-10-CM | POA: Diagnosis not present

## 2021-06-10 DIAGNOSIS — M159 Polyosteoarthritis, unspecified: Secondary | ICD-10-CM | POA: Diagnosis not present

## 2021-06-10 DIAGNOSIS — E1165 Type 2 diabetes mellitus with hyperglycemia: Secondary | ICD-10-CM | POA: Diagnosis not present

## 2021-06-10 DIAGNOSIS — E781 Pure hyperglyceridemia: Secondary | ICD-10-CM | POA: Diagnosis not present

## 2021-06-10 DIAGNOSIS — K219 Gastro-esophageal reflux disease without esophagitis: Secondary | ICD-10-CM | POA: Diagnosis not present

## 2021-06-10 DIAGNOSIS — E782 Mixed hyperlipidemia: Secondary | ICD-10-CM | POA: Diagnosis not present

## 2021-06-10 DIAGNOSIS — E1159 Type 2 diabetes mellitus with other circulatory complications: Secondary | ICD-10-CM | POA: Diagnosis not present

## 2021-06-10 DIAGNOSIS — R69 Illness, unspecified: Secondary | ICD-10-CM | POA: Diagnosis not present

## 2021-06-18 DIAGNOSIS — E1159 Type 2 diabetes mellitus with other circulatory complications: Secondary | ICD-10-CM | POA: Diagnosis not present

## 2021-06-18 DIAGNOSIS — M159 Polyosteoarthritis, unspecified: Secondary | ICD-10-CM | POA: Diagnosis not present

## 2021-06-18 DIAGNOSIS — E781 Pure hyperglyceridemia: Secondary | ICD-10-CM | POA: Diagnosis not present

## 2021-06-18 DIAGNOSIS — K219 Gastro-esophageal reflux disease without esophagitis: Secondary | ICD-10-CM | POA: Diagnosis not present

## 2021-06-18 DIAGNOSIS — R69 Illness, unspecified: Secondary | ICD-10-CM | POA: Diagnosis not present

## 2021-06-18 DIAGNOSIS — I10 Essential (primary) hypertension: Secondary | ICD-10-CM | POA: Diagnosis not present

## 2021-06-18 DIAGNOSIS — E1165 Type 2 diabetes mellitus with hyperglycemia: Secondary | ICD-10-CM | POA: Diagnosis not present

## 2021-06-18 DIAGNOSIS — E782 Mixed hyperlipidemia: Secondary | ICD-10-CM | POA: Diagnosis not present

## 2021-08-30 DIAGNOSIS — G4733 Obstructive sleep apnea (adult) (pediatric): Secondary | ICD-10-CM | POA: Diagnosis not present

## 2021-09-06 DIAGNOSIS — I10 Essential (primary) hypertension: Secondary | ICD-10-CM | POA: Diagnosis not present

## 2021-09-06 DIAGNOSIS — K219 Gastro-esophageal reflux disease without esophagitis: Secondary | ICD-10-CM | POA: Diagnosis not present

## 2021-09-06 DIAGNOSIS — R69 Illness, unspecified: Secondary | ICD-10-CM | POA: Diagnosis not present

## 2021-09-06 DIAGNOSIS — E782 Mixed hyperlipidemia: Secondary | ICD-10-CM | POA: Diagnosis not present

## 2021-09-06 DIAGNOSIS — M159 Polyosteoarthritis, unspecified: Secondary | ICD-10-CM | POA: Diagnosis not present

## 2021-09-06 DIAGNOSIS — E781 Pure hyperglyceridemia: Secondary | ICD-10-CM | POA: Diagnosis not present

## 2021-09-06 DIAGNOSIS — E1159 Type 2 diabetes mellitus with other circulatory complications: Secondary | ICD-10-CM | POA: Diagnosis not present

## 2021-09-06 DIAGNOSIS — E1165 Type 2 diabetes mellitus with hyperglycemia: Secondary | ICD-10-CM | POA: Diagnosis not present

## 2021-09-09 DIAGNOSIS — E782 Mixed hyperlipidemia: Secondary | ICD-10-CM | POA: Diagnosis not present

## 2021-09-09 DIAGNOSIS — I10 Essential (primary) hypertension: Secondary | ICD-10-CM | POA: Diagnosis not present

## 2021-09-09 DIAGNOSIS — Z7984 Long term (current) use of oral hypoglycemic drugs: Secondary | ICD-10-CM | POA: Diagnosis not present

## 2021-09-09 DIAGNOSIS — E1165 Type 2 diabetes mellitus with hyperglycemia: Secondary | ICD-10-CM | POA: Diagnosis not present

## 2021-09-09 DIAGNOSIS — E1159 Type 2 diabetes mellitus with other circulatory complications: Secondary | ICD-10-CM | POA: Diagnosis not present

## 2021-09-13 DIAGNOSIS — E1159 Type 2 diabetes mellitus with other circulatory complications: Secondary | ICD-10-CM | POA: Diagnosis not present

## 2021-09-13 DIAGNOSIS — M159 Polyosteoarthritis, unspecified: Secondary | ICD-10-CM | POA: Diagnosis not present

## 2021-09-13 DIAGNOSIS — G4733 Obstructive sleep apnea (adult) (pediatric): Secondary | ICD-10-CM | POA: Diagnosis not present

## 2021-09-13 DIAGNOSIS — E781 Pure hyperglyceridemia: Secondary | ICD-10-CM | POA: Diagnosis not present

## 2021-09-13 DIAGNOSIS — M85852 Other specified disorders of bone density and structure, left thigh: Secondary | ICD-10-CM | POA: Diagnosis not present

## 2021-09-13 DIAGNOSIS — E782 Mixed hyperlipidemia: Secondary | ICD-10-CM | POA: Diagnosis not present

## 2021-09-13 DIAGNOSIS — R69 Illness, unspecified: Secondary | ICD-10-CM | POA: Diagnosis not present

## 2021-09-24 DIAGNOSIS — H25013 Cortical age-related cataract, bilateral: Secondary | ICD-10-CM | POA: Diagnosis not present

## 2021-09-24 DIAGNOSIS — H25043 Posterior subcapsular polar age-related cataract, bilateral: Secondary | ICD-10-CM | POA: Diagnosis not present

## 2021-09-24 DIAGNOSIS — H40013 Open angle with borderline findings, low risk, bilateral: Secondary | ICD-10-CM | POA: Diagnosis not present

## 2021-09-24 DIAGNOSIS — H2513 Age-related nuclear cataract, bilateral: Secondary | ICD-10-CM | POA: Diagnosis not present

## 2021-10-06 DIAGNOSIS — R69 Illness, unspecified: Secondary | ICD-10-CM | POA: Diagnosis not present

## 2021-10-06 DIAGNOSIS — I10 Essential (primary) hypertension: Secondary | ICD-10-CM | POA: Diagnosis not present

## 2021-10-06 DIAGNOSIS — K219 Gastro-esophageal reflux disease without esophagitis: Secondary | ICD-10-CM | POA: Diagnosis not present

## 2021-10-06 DIAGNOSIS — M159 Polyosteoarthritis, unspecified: Secondary | ICD-10-CM | POA: Diagnosis not present

## 2021-10-06 DIAGNOSIS — E1165 Type 2 diabetes mellitus with hyperglycemia: Secondary | ICD-10-CM | POA: Diagnosis not present

## 2021-10-06 DIAGNOSIS — E1159 Type 2 diabetes mellitus with other circulatory complications: Secondary | ICD-10-CM | POA: Diagnosis not present

## 2021-10-06 DIAGNOSIS — E781 Pure hyperglyceridemia: Secondary | ICD-10-CM | POA: Diagnosis not present

## 2021-10-06 DIAGNOSIS — E782 Mixed hyperlipidemia: Secondary | ICD-10-CM | POA: Diagnosis not present

## 2021-11-08 DIAGNOSIS — Z1389 Encounter for screening for other disorder: Secondary | ICD-10-CM | POA: Diagnosis not present

## 2021-11-08 DIAGNOSIS — Z0000 Encounter for general adult medical examination without abnormal findings: Secondary | ICD-10-CM | POA: Diagnosis not present

## 2021-11-09 DIAGNOSIS — G4733 Obstructive sleep apnea (adult) (pediatric): Secondary | ICD-10-CM | POA: Diagnosis not present

## 2021-12-07 DIAGNOSIS — R69 Illness, unspecified: Secondary | ICD-10-CM | POA: Diagnosis not present

## 2021-12-07 DIAGNOSIS — I10 Essential (primary) hypertension: Secondary | ICD-10-CM | POA: Diagnosis not present

## 2021-12-07 DIAGNOSIS — K219 Gastro-esophageal reflux disease without esophagitis: Secondary | ICD-10-CM | POA: Diagnosis not present

## 2021-12-07 DIAGNOSIS — E781 Pure hyperglyceridemia: Secondary | ICD-10-CM | POA: Diagnosis not present

## 2021-12-07 DIAGNOSIS — E1159 Type 2 diabetes mellitus with other circulatory complications: Secondary | ICD-10-CM | POA: Diagnosis not present

## 2021-12-07 DIAGNOSIS — M159 Polyosteoarthritis, unspecified: Secondary | ICD-10-CM | POA: Diagnosis not present

## 2021-12-07 DIAGNOSIS — E782 Mixed hyperlipidemia: Secondary | ICD-10-CM | POA: Diagnosis not present

## 2021-12-07 DIAGNOSIS — E1165 Type 2 diabetes mellitus with hyperglycemia: Secondary | ICD-10-CM | POA: Diagnosis not present

## 2021-12-08 DIAGNOSIS — M810 Age-related osteoporosis without current pathological fracture: Secondary | ICD-10-CM | POA: Diagnosis not present

## 2021-12-08 DIAGNOSIS — Z780 Asymptomatic menopausal state: Secondary | ICD-10-CM | POA: Diagnosis not present

## 2021-12-08 DIAGNOSIS — Z1231 Encounter for screening mammogram for malignant neoplasm of breast: Secondary | ICD-10-CM | POA: Diagnosis not present

## 2021-12-17 DIAGNOSIS — M810 Age-related osteoporosis without current pathological fracture: Secondary | ICD-10-CM | POA: Diagnosis not present

## 2022-02-11 DIAGNOSIS — J40 Bronchitis, not specified as acute or chronic: Secondary | ICD-10-CM | POA: Diagnosis not present

## 2022-02-24 DIAGNOSIS — M810 Age-related osteoporosis without current pathological fracture: Secondary | ICD-10-CM | POA: Diagnosis not present

## 2022-02-24 DIAGNOSIS — R609 Edema, unspecified: Secondary | ICD-10-CM | POA: Diagnosis not present

## 2022-02-24 DIAGNOSIS — R32 Unspecified urinary incontinence: Secondary | ICD-10-CM | POA: Diagnosis not present

## 2022-02-24 DIAGNOSIS — E1165 Type 2 diabetes mellitus with hyperglycemia: Secondary | ICD-10-CM | POA: Diagnosis not present

## 2022-02-24 DIAGNOSIS — R69 Illness, unspecified: Secondary | ICD-10-CM | POA: Diagnosis not present

## 2022-02-24 DIAGNOSIS — I10 Essential (primary) hypertension: Secondary | ICD-10-CM | POA: Diagnosis not present

## 2022-02-24 DIAGNOSIS — G4733 Obstructive sleep apnea (adult) (pediatric): Secondary | ICD-10-CM | POA: Diagnosis not present

## 2022-02-24 DIAGNOSIS — Z6841 Body Mass Index (BMI) 40.0 and over, adult: Secondary | ICD-10-CM | POA: Diagnosis not present

## 2022-02-24 DIAGNOSIS — E785 Hyperlipidemia, unspecified: Secondary | ICD-10-CM | POA: Diagnosis not present

## 2022-02-24 DIAGNOSIS — Z794 Long term (current) use of insulin: Secondary | ICD-10-CM | POA: Diagnosis not present

## 2022-02-24 DIAGNOSIS — K219 Gastro-esophageal reflux disease without esophagitis: Secondary | ICD-10-CM | POA: Diagnosis not present

## 2022-03-10 DIAGNOSIS — E1159 Type 2 diabetes mellitus with other circulatory complications: Secondary | ICD-10-CM | POA: Diagnosis not present

## 2022-03-10 DIAGNOSIS — E782 Mixed hyperlipidemia: Secondary | ICD-10-CM | POA: Diagnosis not present

## 2022-03-15 DIAGNOSIS — G4733 Obstructive sleep apnea (adult) (pediatric): Secondary | ICD-10-CM | POA: Diagnosis not present

## 2022-03-15 DIAGNOSIS — M810 Age-related osteoporosis without current pathological fracture: Secondary | ICD-10-CM | POA: Diagnosis not present

## 2022-03-15 DIAGNOSIS — E782 Mixed hyperlipidemia: Secondary | ICD-10-CM | POA: Diagnosis not present

## 2022-03-15 DIAGNOSIS — I10 Essential (primary) hypertension: Secondary | ICD-10-CM | POA: Diagnosis not present

## 2022-03-15 DIAGNOSIS — K219 Gastro-esophageal reflux disease without esophagitis: Secondary | ICD-10-CM | POA: Diagnosis not present

## 2022-03-15 DIAGNOSIS — R69 Illness, unspecified: Secondary | ICD-10-CM | POA: Diagnosis not present

## 2022-06-30 DIAGNOSIS — G4733 Obstructive sleep apnea (adult) (pediatric): Secondary | ICD-10-CM | POA: Diagnosis not present

## 2022-07-31 DIAGNOSIS — G4733 Obstructive sleep apnea (adult) (pediatric): Secondary | ICD-10-CM | POA: Diagnosis not present

## 2022-08-30 DIAGNOSIS — G4733 Obstructive sleep apnea (adult) (pediatric): Secondary | ICD-10-CM | POA: Diagnosis not present

## 2022-08-31 DIAGNOSIS — I10 Essential (primary) hypertension: Secondary | ICD-10-CM | POA: Diagnosis not present

## 2022-08-31 DIAGNOSIS — E782 Mixed hyperlipidemia: Secondary | ICD-10-CM | POA: Diagnosis not present

## 2022-08-31 DIAGNOSIS — M159 Polyosteoarthritis, unspecified: Secondary | ICD-10-CM | POA: Diagnosis not present

## 2022-08-31 DIAGNOSIS — E1159 Type 2 diabetes mellitus with other circulatory complications: Secondary | ICD-10-CM | POA: Diagnosis not present

## 2022-08-31 DIAGNOSIS — G4733 Obstructive sleep apnea (adult) (pediatric): Secondary | ICD-10-CM | POA: Diagnosis not present

## 2022-08-31 DIAGNOSIS — R69 Illness, unspecified: Secondary | ICD-10-CM | POA: Diagnosis not present

## 2022-08-31 DIAGNOSIS — E1165 Type 2 diabetes mellitus with hyperglycemia: Secondary | ICD-10-CM | POA: Diagnosis not present

## 2022-09-23 DIAGNOSIS — E782 Mixed hyperlipidemia: Secondary | ICD-10-CM | POA: Diagnosis not present

## 2022-09-23 DIAGNOSIS — E1165 Type 2 diabetes mellitus with hyperglycemia: Secondary | ICD-10-CM | POA: Diagnosis not present

## 2022-09-27 DIAGNOSIS — H25043 Posterior subcapsular polar age-related cataract, bilateral: Secondary | ICD-10-CM | POA: Diagnosis not present

## 2022-09-27 DIAGNOSIS — H25013 Cortical age-related cataract, bilateral: Secondary | ICD-10-CM | POA: Diagnosis not present

## 2022-09-27 DIAGNOSIS — Z0100 Encounter for examination of eyes and vision without abnormal findings: Secondary | ICD-10-CM | POA: Diagnosis not present

## 2022-09-27 DIAGNOSIS — H2513 Age-related nuclear cataract, bilateral: Secondary | ICD-10-CM | POA: Diagnosis not present

## 2022-09-27 DIAGNOSIS — H40013 Open angle with borderline findings, low risk, bilateral: Secondary | ICD-10-CM | POA: Diagnosis not present

## 2022-09-28 DIAGNOSIS — G4733 Obstructive sleep apnea (adult) (pediatric): Secondary | ICD-10-CM | POA: Diagnosis not present

## 2022-09-29 DIAGNOSIS — Z0100 Encounter for examination of eyes and vision without abnormal findings: Secondary | ICD-10-CM | POA: Diagnosis not present

## 2022-09-30 DIAGNOSIS — G4733 Obstructive sleep apnea (adult) (pediatric): Secondary | ICD-10-CM | POA: Diagnosis not present

## 2022-10-03 DIAGNOSIS — K219 Gastro-esophageal reflux disease without esophagitis: Secondary | ICD-10-CM | POA: Diagnosis not present

## 2022-10-03 DIAGNOSIS — I10 Essential (primary) hypertension: Secondary | ICD-10-CM | POA: Diagnosis not present

## 2022-10-03 DIAGNOSIS — Z6838 Body mass index (BMI) 38.0-38.9, adult: Secondary | ICD-10-CM | POA: Diagnosis not present

## 2022-10-03 DIAGNOSIS — Z23 Encounter for immunization: Secondary | ICD-10-CM | POA: Diagnosis not present

## 2022-10-03 DIAGNOSIS — M810 Age-related osteoporosis without current pathological fracture: Secondary | ICD-10-CM | POA: Diagnosis not present

## 2022-10-03 DIAGNOSIS — E1159 Type 2 diabetes mellitus with other circulatory complications: Secondary | ICD-10-CM | POA: Diagnosis not present

## 2022-10-03 DIAGNOSIS — R69 Illness, unspecified: Secondary | ICD-10-CM | POA: Diagnosis not present

## 2022-10-03 DIAGNOSIS — G4733 Obstructive sleep apnea (adult) (pediatric): Secondary | ICD-10-CM | POA: Diagnosis not present

## 2022-10-03 DIAGNOSIS — E782 Mixed hyperlipidemia: Secondary | ICD-10-CM | POA: Diagnosis not present

## 2022-10-25 DIAGNOSIS — M25561 Pain in right knee: Secondary | ICD-10-CM | POA: Diagnosis not present

## 2022-10-25 DIAGNOSIS — Z6838 Body mass index (BMI) 38.0-38.9, adult: Secondary | ICD-10-CM | POA: Diagnosis not present

## 2022-10-29 DIAGNOSIS — G4733 Obstructive sleep apnea (adult) (pediatric): Secondary | ICD-10-CM | POA: Diagnosis not present

## 2022-10-31 DIAGNOSIS — G4733 Obstructive sleep apnea (adult) (pediatric): Secondary | ICD-10-CM | POA: Diagnosis not present

## 2022-11-02 DIAGNOSIS — M25561 Pain in right knee: Secondary | ICD-10-CM | POA: Diagnosis not present

## 2022-11-02 DIAGNOSIS — M1711 Unilateral primary osteoarthritis, right knee: Secondary | ICD-10-CM | POA: Diagnosis not present

## 2022-11-10 DIAGNOSIS — Z23 Encounter for immunization: Secondary | ICD-10-CM | POA: Diagnosis not present

## 2022-11-10 DIAGNOSIS — Z1389 Encounter for screening for other disorder: Secondary | ICD-10-CM | POA: Diagnosis not present

## 2022-11-10 DIAGNOSIS — Z0000 Encounter for general adult medical examination without abnormal findings: Secondary | ICD-10-CM | POA: Diagnosis not present

## 2022-11-28 DIAGNOSIS — G4733 Obstructive sleep apnea (adult) (pediatric): Secondary | ICD-10-CM | POA: Diagnosis not present

## 2022-11-30 DIAGNOSIS — E1159 Type 2 diabetes mellitus with other circulatory complications: Secondary | ICD-10-CM | POA: Diagnosis not present

## 2022-11-30 DIAGNOSIS — I10 Essential (primary) hypertension: Secondary | ICD-10-CM | POA: Diagnosis not present

## 2022-11-30 DIAGNOSIS — E782 Mixed hyperlipidemia: Secondary | ICD-10-CM | POA: Diagnosis not present

## 2022-11-30 DIAGNOSIS — R69 Illness, unspecified: Secondary | ICD-10-CM | POA: Diagnosis not present

## 2022-11-30 DIAGNOSIS — E1165 Type 2 diabetes mellitus with hyperglycemia: Secondary | ICD-10-CM | POA: Diagnosis not present

## 2022-11-30 DIAGNOSIS — G4733 Obstructive sleep apnea (adult) (pediatric): Secondary | ICD-10-CM | POA: Diagnosis not present

## 2022-11-30 DIAGNOSIS — K219 Gastro-esophageal reflux disease without esophagitis: Secondary | ICD-10-CM | POA: Diagnosis not present

## 2022-11-30 DIAGNOSIS — M159 Polyosteoarthritis, unspecified: Secondary | ICD-10-CM | POA: Diagnosis not present

## 2022-12-09 DIAGNOSIS — Z1231 Encounter for screening mammogram for malignant neoplasm of breast: Secondary | ICD-10-CM | POA: Diagnosis not present

## 2022-12-28 ENCOUNTER — Other Ambulatory Visit: Payer: Self-pay | Admitting: Sports Medicine

## 2022-12-28 ENCOUNTER — Ambulatory Visit
Admission: RE | Admit: 2022-12-28 | Discharge: 2022-12-28 | Disposition: A | Discharge status: Home / Self Care | Payer: No Typology Code available for payment source | Source: Ambulatory Visit | Attending: Sports Medicine | Admitting: Sports Medicine

## 2022-12-28 DIAGNOSIS — M25562 Pain in left knee: Secondary | ICD-10-CM

## 2022-12-28 DIAGNOSIS — M1711 Unilateral primary osteoarthritis, right knee: Secondary | ICD-10-CM | POA: Diagnosis not present

## 2023-01-19 DIAGNOSIS — E669 Obesity, unspecified: Secondary | ICD-10-CM | POA: Diagnosis not present

## 2023-01-19 DIAGNOSIS — E1159 Type 2 diabetes mellitus with other circulatory complications: Secondary | ICD-10-CM | POA: Diagnosis not present

## 2023-01-19 DIAGNOSIS — Z6837 Body mass index (BMI) 37.0-37.9, adult: Secondary | ICD-10-CM | POA: Diagnosis not present

## 2023-01-23 DIAGNOSIS — M1711 Unilateral primary osteoarthritis, right knee: Secondary | ICD-10-CM | POA: Diagnosis not present

## 2023-01-30 DIAGNOSIS — I10 Essential (primary) hypertension: Secondary | ICD-10-CM | POA: Diagnosis not present

## 2023-01-30 DIAGNOSIS — E1165 Type 2 diabetes mellitus with hyperglycemia: Secondary | ICD-10-CM | POA: Diagnosis not present

## 2023-01-30 DIAGNOSIS — K219 Gastro-esophageal reflux disease without esophagitis: Secondary | ICD-10-CM | POA: Diagnosis not present

## 2023-01-30 DIAGNOSIS — E782 Mixed hyperlipidemia: Secondary | ICD-10-CM | POA: Diagnosis not present

## 2023-01-30 DIAGNOSIS — M159 Polyosteoarthritis, unspecified: Secondary | ICD-10-CM | POA: Diagnosis not present

## 2023-02-27 DIAGNOSIS — E1159 Type 2 diabetes mellitus with other circulatory complications: Secondary | ICD-10-CM | POA: Diagnosis not present

## 2023-02-27 DIAGNOSIS — I10 Essential (primary) hypertension: Secondary | ICD-10-CM | POA: Diagnosis not present

## 2023-02-27 DIAGNOSIS — M159 Polyosteoarthritis, unspecified: Secondary | ICD-10-CM | POA: Diagnosis not present

## 2023-02-27 DIAGNOSIS — E782 Mixed hyperlipidemia: Secondary | ICD-10-CM | POA: Diagnosis not present

## 2023-02-27 DIAGNOSIS — E1165 Type 2 diabetes mellitus with hyperglycemia: Secondary | ICD-10-CM | POA: Diagnosis not present

## 2023-02-27 DIAGNOSIS — F3342 Major depressive disorder, recurrent, in full remission: Secondary | ICD-10-CM | POA: Diagnosis not present

## 2023-02-27 DIAGNOSIS — M810 Age-related osteoporosis without current pathological fracture: Secondary | ICD-10-CM | POA: Diagnosis not present

## 2023-02-27 DIAGNOSIS — K219 Gastro-esophageal reflux disease without esophagitis: Secondary | ICD-10-CM | POA: Diagnosis not present

## 2023-03-29 DIAGNOSIS — E782 Mixed hyperlipidemia: Secondary | ICD-10-CM | POA: Diagnosis not present

## 2023-03-29 DIAGNOSIS — K219 Gastro-esophageal reflux disease without esophagitis: Secondary | ICD-10-CM | POA: Diagnosis not present

## 2023-03-29 DIAGNOSIS — F3341 Major depressive disorder, recurrent, in partial remission: Secondary | ICD-10-CM | POA: Diagnosis not present

## 2023-03-29 DIAGNOSIS — E1159 Type 2 diabetes mellitus with other circulatory complications: Secondary | ICD-10-CM | POA: Diagnosis not present

## 2023-03-29 DIAGNOSIS — M159 Polyosteoarthritis, unspecified: Secondary | ICD-10-CM | POA: Diagnosis not present

## 2023-03-29 DIAGNOSIS — I10 Essential (primary) hypertension: Secondary | ICD-10-CM | POA: Diagnosis not present

## 2023-03-31 DIAGNOSIS — M810 Age-related osteoporosis without current pathological fracture: Secondary | ICD-10-CM | POA: Diagnosis not present

## 2023-03-31 DIAGNOSIS — E1159 Type 2 diabetes mellitus with other circulatory complications: Secondary | ICD-10-CM | POA: Diagnosis not present

## 2023-04-11 DIAGNOSIS — E782 Mixed hyperlipidemia: Secondary | ICD-10-CM | POA: Diagnosis not present

## 2023-04-11 DIAGNOSIS — K219 Gastro-esophageal reflux disease without esophagitis: Secondary | ICD-10-CM | POA: Diagnosis not present

## 2023-04-11 DIAGNOSIS — G4733 Obstructive sleep apnea (adult) (pediatric): Secondary | ICD-10-CM | POA: Diagnosis not present

## 2023-04-11 DIAGNOSIS — I10 Essential (primary) hypertension: Secondary | ICD-10-CM | POA: Diagnosis not present

## 2023-04-11 DIAGNOSIS — E119 Type 2 diabetes mellitus without complications: Secondary | ICD-10-CM | POA: Diagnosis not present

## 2023-04-11 DIAGNOSIS — F3341 Major depressive disorder, recurrent, in partial remission: Secondary | ICD-10-CM | POA: Diagnosis not present

## 2023-04-11 DIAGNOSIS — M159 Polyosteoarthritis, unspecified: Secondary | ICD-10-CM | POA: Diagnosis not present

## 2023-04-11 DIAGNOSIS — M810 Age-related osteoporosis without current pathological fracture: Secondary | ICD-10-CM | POA: Diagnosis not present

## 2023-05-05 DIAGNOSIS — E782 Mixed hyperlipidemia: Secondary | ICD-10-CM | POA: Diagnosis not present

## 2023-05-05 DIAGNOSIS — I10 Essential (primary) hypertension: Secondary | ICD-10-CM | POA: Diagnosis not present

## 2023-05-05 DIAGNOSIS — F3341 Major depressive disorder, recurrent, in partial remission: Secondary | ICD-10-CM | POA: Diagnosis not present

## 2023-05-05 DIAGNOSIS — K219 Gastro-esophageal reflux disease without esophagitis: Secondary | ICD-10-CM | POA: Diagnosis not present

## 2023-05-05 DIAGNOSIS — M159 Polyosteoarthritis, unspecified: Secondary | ICD-10-CM | POA: Diagnosis not present

## 2023-05-05 DIAGNOSIS — E1159 Type 2 diabetes mellitus with other circulatory complications: Secondary | ICD-10-CM | POA: Diagnosis not present

## 2023-05-24 DIAGNOSIS — M1712 Unilateral primary osteoarthritis, left knee: Secondary | ICD-10-CM | POA: Diagnosis not present

## 2023-05-25 DIAGNOSIS — E1159 Type 2 diabetes mellitus with other circulatory complications: Secondary | ICD-10-CM | POA: Diagnosis not present

## 2023-05-25 DIAGNOSIS — M159 Polyosteoarthritis, unspecified: Secondary | ICD-10-CM | POA: Diagnosis not present

## 2023-05-25 DIAGNOSIS — F3341 Major depressive disorder, recurrent, in partial remission: Secondary | ICD-10-CM | POA: Diagnosis not present

## 2023-05-25 DIAGNOSIS — I10 Essential (primary) hypertension: Secondary | ICD-10-CM | POA: Diagnosis not present

## 2023-05-25 DIAGNOSIS — K219 Gastro-esophageal reflux disease without esophagitis: Secondary | ICD-10-CM | POA: Diagnosis not present

## 2023-05-25 DIAGNOSIS — E782 Mixed hyperlipidemia: Secondary | ICD-10-CM | POA: Diagnosis not present

## 2023-06-26 DIAGNOSIS — E782 Mixed hyperlipidemia: Secondary | ICD-10-CM | POA: Diagnosis not present

## 2023-06-26 DIAGNOSIS — M159 Polyosteoarthritis, unspecified: Secondary | ICD-10-CM | POA: Diagnosis not present

## 2023-06-26 DIAGNOSIS — F3341 Major depressive disorder, recurrent, in partial remission: Secondary | ICD-10-CM | POA: Diagnosis not present

## 2023-06-26 DIAGNOSIS — E1159 Type 2 diabetes mellitus with other circulatory complications: Secondary | ICD-10-CM | POA: Diagnosis not present

## 2023-06-26 DIAGNOSIS — K219 Gastro-esophageal reflux disease without esophagitis: Secondary | ICD-10-CM | POA: Diagnosis not present

## 2023-06-26 DIAGNOSIS — M810 Age-related osteoporosis without current pathological fracture: Secondary | ICD-10-CM | POA: Diagnosis not present

## 2023-06-26 DIAGNOSIS — I10 Essential (primary) hypertension: Secondary | ICD-10-CM | POA: Diagnosis not present

## 2023-07-03 DIAGNOSIS — D122 Benign neoplasm of ascending colon: Secondary | ICD-10-CM | POA: Diagnosis not present

## 2023-07-03 DIAGNOSIS — Z09 Encounter for follow-up examination after completed treatment for conditions other than malignant neoplasm: Secondary | ICD-10-CM | POA: Diagnosis not present

## 2023-07-03 DIAGNOSIS — K644 Residual hemorrhoidal skin tags: Secondary | ICD-10-CM | POA: Diagnosis not present

## 2023-07-03 DIAGNOSIS — Z86010 Personal history of colonic polyps: Secondary | ICD-10-CM | POA: Diagnosis not present

## 2023-07-03 DIAGNOSIS — K621 Rectal polyp: Secondary | ICD-10-CM | POA: Diagnosis not present

## 2023-07-05 DIAGNOSIS — D122 Benign neoplasm of ascending colon: Secondary | ICD-10-CM | POA: Diagnosis not present

## 2023-07-05 DIAGNOSIS — K621 Rectal polyp: Secondary | ICD-10-CM | POA: Diagnosis not present

## 2023-07-11 DIAGNOSIS — Z6837 Body mass index (BMI) 37.0-37.9, adult: Secondary | ICD-10-CM | POA: Diagnosis not present

## 2023-07-11 DIAGNOSIS — R350 Frequency of micturition: Secondary | ICD-10-CM | POA: Diagnosis not present

## 2023-07-11 DIAGNOSIS — N390 Urinary tract infection, site not specified: Secondary | ICD-10-CM | POA: Diagnosis not present

## 2023-07-19 DIAGNOSIS — M1712 Unilateral primary osteoarthritis, left knee: Secondary | ICD-10-CM | POA: Diagnosis not present

## 2023-07-26 DIAGNOSIS — M1712 Unilateral primary osteoarthritis, left knee: Secondary | ICD-10-CM | POA: Diagnosis not present

## 2023-08-02 DIAGNOSIS — M170 Bilateral primary osteoarthritis of knee: Secondary | ICD-10-CM | POA: Diagnosis not present

## 2023-08-09 DIAGNOSIS — M170 Bilateral primary osteoarthritis of knee: Secondary | ICD-10-CM | POA: Diagnosis not present

## 2023-08-16 DIAGNOSIS — M1711 Unilateral primary osteoarthritis, right knee: Secondary | ICD-10-CM | POA: Diagnosis not present

## 2023-09-12 DIAGNOSIS — G4733 Obstructive sleep apnea (adult) (pediatric): Secondary | ICD-10-CM | POA: Diagnosis not present

## 2023-11-15 DIAGNOSIS — Z23 Encounter for immunization: Secondary | ICD-10-CM | POA: Diagnosis not present

## 2023-11-15 DIAGNOSIS — Z6836 Body mass index (BMI) 36.0-36.9, adult: Secondary | ICD-10-CM | POA: Diagnosis not present

## 2023-11-15 DIAGNOSIS — Z1331 Encounter for screening for depression: Secondary | ICD-10-CM | POA: Diagnosis not present

## 2023-11-15 DIAGNOSIS — Z0000 Encounter for general adult medical examination without abnormal findings: Secondary | ICD-10-CM | POA: Diagnosis not present

## 2023-11-28 DIAGNOSIS — H2513 Age-related nuclear cataract, bilateral: Secondary | ICD-10-CM | POA: Diagnosis not present

## 2023-11-28 DIAGNOSIS — H40013 Open angle with borderline findings, low risk, bilateral: Secondary | ICD-10-CM | POA: Diagnosis not present

## 2023-11-28 DIAGNOSIS — E119 Type 2 diabetes mellitus without complications: Secondary | ICD-10-CM | POA: Diagnosis not present

## 2023-11-28 DIAGNOSIS — H18413 Arcus senilis, bilateral: Secondary | ICD-10-CM | POA: Diagnosis not present

## 2024-05-08 DIAGNOSIS — E1159 Type 2 diabetes mellitus with other circulatory complications: Secondary | ICD-10-CM | POA: Diagnosis not present

## 2024-05-08 DIAGNOSIS — I10 Essential (primary) hypertension: Secondary | ICD-10-CM | POA: Diagnosis not present

## 2024-05-11 DIAGNOSIS — M159 Polyosteoarthritis, unspecified: Secondary | ICD-10-CM | POA: Diagnosis not present

## 2024-05-11 DIAGNOSIS — E669 Obesity, unspecified: Secondary | ICD-10-CM | POA: Diagnosis not present

## 2024-05-11 DIAGNOSIS — F3342 Major depressive disorder, recurrent, in full remission: Secondary | ICD-10-CM | POA: Diagnosis not present

## 2024-05-27 DIAGNOSIS — Z6838 Body mass index (BMI) 38.0-38.9, adult: Secondary | ICD-10-CM | POA: Diagnosis not present

## 2024-05-27 DIAGNOSIS — E782 Mixed hyperlipidemia: Secondary | ICD-10-CM | POA: Diagnosis not present

## 2024-05-27 DIAGNOSIS — I10 Essential (primary) hypertension: Secondary | ICD-10-CM | POA: Diagnosis not present

## 2024-05-27 DIAGNOSIS — K219 Gastro-esophageal reflux disease without esophagitis: Secondary | ICD-10-CM | POA: Diagnosis not present

## 2024-05-27 DIAGNOSIS — F3342 Major depressive disorder, recurrent, in full remission: Secondary | ICD-10-CM | POA: Diagnosis not present

## 2024-05-27 DIAGNOSIS — Z79899 Other long term (current) drug therapy: Secondary | ICD-10-CM | POA: Diagnosis not present

## 2024-05-27 DIAGNOSIS — Z794 Long term (current) use of insulin: Secondary | ICD-10-CM | POA: Diagnosis not present

## 2024-05-27 DIAGNOSIS — E1159 Type 2 diabetes mellitus with other circulatory complications: Secondary | ICD-10-CM | POA: Diagnosis not present

## 2024-05-27 DIAGNOSIS — M810 Age-related osteoporosis without current pathological fracture: Secondary | ICD-10-CM | POA: Diagnosis not present

## 2024-06-04 DIAGNOSIS — R1909 Other intra-abdominal and pelvic swelling, mass and lump: Secondary | ICD-10-CM | POA: Diagnosis not present

## 2024-06-04 DIAGNOSIS — R1905 Periumbilic swelling, mass or lump: Secondary | ICD-10-CM | POA: Diagnosis not present

## 2024-06-06 DIAGNOSIS — I10 Essential (primary) hypertension: Secondary | ICD-10-CM | POA: Diagnosis not present

## 2024-06-06 DIAGNOSIS — E1159 Type 2 diabetes mellitus with other circulatory complications: Secondary | ICD-10-CM | POA: Diagnosis not present

## 2024-06-07 DIAGNOSIS — M8588 Other specified disorders of bone density and structure, other site: Secondary | ICD-10-CM | POA: Diagnosis not present

## 2024-06-07 DIAGNOSIS — R29890 Loss of height: Secondary | ICD-10-CM | POA: Diagnosis not present

## 2024-06-10 DIAGNOSIS — M25561 Pain in right knee: Secondary | ICD-10-CM | POA: Diagnosis not present

## 2024-06-10 DIAGNOSIS — M79644 Pain in right finger(s): Secondary | ICD-10-CM | POA: Diagnosis not present

## 2024-06-10 DIAGNOSIS — M79645 Pain in left finger(s): Secondary | ICD-10-CM | POA: Diagnosis not present

## 2024-06-10 DIAGNOSIS — M25562 Pain in left knee: Secondary | ICD-10-CM | POA: Diagnosis not present

## 2024-07-03 DIAGNOSIS — M25561 Pain in right knee: Secondary | ICD-10-CM | POA: Diagnosis not present

## 2024-07-03 DIAGNOSIS — M25562 Pain in left knee: Secondary | ICD-10-CM | POA: Diagnosis not present

## 2024-07-03 DIAGNOSIS — M170 Bilateral primary osteoarthritis of knee: Secondary | ICD-10-CM | POA: Diagnosis not present

## 2024-07-06 DIAGNOSIS — I10 Essential (primary) hypertension: Secondary | ICD-10-CM | POA: Diagnosis not present

## 2024-07-06 DIAGNOSIS — E1159 Type 2 diabetes mellitus with other circulatory complications: Secondary | ICD-10-CM | POA: Diagnosis not present

## 2024-07-10 DIAGNOSIS — M170 Bilateral primary osteoarthritis of knee: Secondary | ICD-10-CM | POA: Diagnosis not present

## 2024-07-10 DIAGNOSIS — M25561 Pain in right knee: Secondary | ICD-10-CM | POA: Diagnosis not present

## 2024-07-10 DIAGNOSIS — M25562 Pain in left knee: Secondary | ICD-10-CM | POA: Diagnosis not present

## 2024-07-11 DIAGNOSIS — F3342 Major depressive disorder, recurrent, in full remission: Secondary | ICD-10-CM | POA: Diagnosis not present

## 2024-07-11 DIAGNOSIS — E1159 Type 2 diabetes mellitus with other circulatory complications: Secondary | ICD-10-CM | POA: Diagnosis not present

## 2024-07-11 DIAGNOSIS — I10 Essential (primary) hypertension: Secondary | ICD-10-CM | POA: Diagnosis not present

## 2024-07-11 DIAGNOSIS — E669 Obesity, unspecified: Secondary | ICD-10-CM | POA: Diagnosis not present

## 2024-07-11 DIAGNOSIS — M159 Polyosteoarthritis, unspecified: Secondary | ICD-10-CM | POA: Diagnosis not present

## 2024-07-17 DIAGNOSIS — M25561 Pain in right knee: Secondary | ICD-10-CM | POA: Diagnosis not present

## 2024-07-17 DIAGNOSIS — M25562 Pain in left knee: Secondary | ICD-10-CM | POA: Diagnosis not present

## 2024-07-17 DIAGNOSIS — M170 Bilateral primary osteoarthritis of knee: Secondary | ICD-10-CM | POA: Diagnosis not present

## 2024-08-01 DIAGNOSIS — R350 Frequency of micturition: Secondary | ICD-10-CM | POA: Diagnosis not present

## 2024-08-01 DIAGNOSIS — Z6837 Body mass index (BMI) 37.0-37.9, adult: Secondary | ICD-10-CM | POA: Diagnosis not present

## 2024-08-05 DIAGNOSIS — E1159 Type 2 diabetes mellitus with other circulatory complications: Secondary | ICD-10-CM | POA: Diagnosis not present

## 2024-08-05 DIAGNOSIS — I10 Essential (primary) hypertension: Secondary | ICD-10-CM | POA: Diagnosis not present

## 2024-08-11 DIAGNOSIS — E669 Obesity, unspecified: Secondary | ICD-10-CM | POA: Diagnosis not present

## 2024-08-11 DIAGNOSIS — F3342 Major depressive disorder, recurrent, in full remission: Secondary | ICD-10-CM | POA: Diagnosis not present

## 2024-08-11 DIAGNOSIS — M159 Polyosteoarthritis, unspecified: Secondary | ICD-10-CM | POA: Diagnosis not present

## 2024-08-14 DIAGNOSIS — Z1231 Encounter for screening mammogram for malignant neoplasm of breast: Secondary | ICD-10-CM | POA: Diagnosis not present

## 2024-09-04 DIAGNOSIS — E1159 Type 2 diabetes mellitus with other circulatory complications: Secondary | ICD-10-CM | POA: Diagnosis not present

## 2024-09-04 DIAGNOSIS — I10 Essential (primary) hypertension: Secondary | ICD-10-CM | POA: Diagnosis not present

## 2024-09-10 DIAGNOSIS — F3342 Major depressive disorder, recurrent, in full remission: Secondary | ICD-10-CM | POA: Diagnosis not present

## 2024-09-10 DIAGNOSIS — M159 Polyosteoarthritis, unspecified: Secondary | ICD-10-CM | POA: Diagnosis not present

## 2024-09-10 DIAGNOSIS — E669 Obesity, unspecified: Secondary | ICD-10-CM | POA: Diagnosis not present

## 2024-09-11 DIAGNOSIS — M810 Age-related osteoporosis without current pathological fracture: Secondary | ICD-10-CM | POA: Diagnosis not present

## 2024-09-11 DIAGNOSIS — Z79899 Other long term (current) drug therapy: Secondary | ICD-10-CM | POA: Diagnosis not present

## 2024-09-11 DIAGNOSIS — E1159 Type 2 diabetes mellitus with other circulatory complications: Secondary | ICD-10-CM | POA: Diagnosis not present

## 2024-09-25 DIAGNOSIS — G4733 Obstructive sleep apnea (adult) (pediatric): Secondary | ICD-10-CM | POA: Diagnosis not present

## 2024-10-02 DIAGNOSIS — K219 Gastro-esophageal reflux disease without esophagitis: Secondary | ICD-10-CM | POA: Diagnosis not present

## 2024-10-02 DIAGNOSIS — M179 Osteoarthritis of knee, unspecified: Secondary | ICD-10-CM | POA: Diagnosis not present

## 2024-10-02 DIAGNOSIS — Z6837 Body mass index (BMI) 37.0-37.9, adult: Secondary | ICD-10-CM | POA: Diagnosis not present

## 2024-10-02 DIAGNOSIS — I10 Essential (primary) hypertension: Secondary | ICD-10-CM | POA: Diagnosis not present

## 2024-10-02 DIAGNOSIS — E782 Mixed hyperlipidemia: Secondary | ICD-10-CM | POA: Diagnosis not present

## 2024-10-02 DIAGNOSIS — E1159 Type 2 diabetes mellitus with other circulatory complications: Secondary | ICD-10-CM | POA: Diagnosis not present

## 2024-10-04 DIAGNOSIS — I10 Essential (primary) hypertension: Secondary | ICD-10-CM | POA: Diagnosis not present

## 2024-10-04 DIAGNOSIS — E1159 Type 2 diabetes mellitus with other circulatory complications: Secondary | ICD-10-CM | POA: Diagnosis not present

## 2024-10-11 DIAGNOSIS — F3342 Major depressive disorder, recurrent, in full remission: Secondary | ICD-10-CM | POA: Diagnosis not present

## 2024-10-11 DIAGNOSIS — E1159 Type 2 diabetes mellitus with other circulatory complications: Secondary | ICD-10-CM | POA: Diagnosis not present

## 2024-10-11 DIAGNOSIS — E669 Obesity, unspecified: Secondary | ICD-10-CM | POA: Diagnosis not present

## 2024-10-11 DIAGNOSIS — M159 Polyosteoarthritis, unspecified: Secondary | ICD-10-CM | POA: Diagnosis not present

## 2024-10-11 DIAGNOSIS — I10 Essential (primary) hypertension: Secondary | ICD-10-CM | POA: Diagnosis not present

## 2024-10-26 DIAGNOSIS — G4733 Obstructive sleep apnea (adult) (pediatric): Secondary | ICD-10-CM | POA: Diagnosis not present

## 2024-10-26 DIAGNOSIS — Z7983 Long term (current) use of bisphosphonates: Secondary | ICD-10-CM | POA: Diagnosis not present

## 2024-10-26 DIAGNOSIS — M810 Age-related osteoporosis without current pathological fracture: Secondary | ICD-10-CM | POA: Diagnosis not present

## 2024-10-26 DIAGNOSIS — Z7984 Long term (current) use of oral hypoglycemic drugs: Secondary | ICD-10-CM | POA: Diagnosis not present

## 2024-10-26 DIAGNOSIS — Z6837 Body mass index (BMI) 37.0-37.9, adult: Secondary | ICD-10-CM | POA: Diagnosis not present

## 2024-10-26 DIAGNOSIS — E1136 Type 2 diabetes mellitus with diabetic cataract: Secondary | ICD-10-CM | POA: Diagnosis not present

## 2024-10-26 DIAGNOSIS — R32 Unspecified urinary incontinence: Secondary | ICD-10-CM | POA: Diagnosis not present

## 2024-10-26 DIAGNOSIS — M1990 Unspecified osteoarthritis, unspecified site: Secondary | ICD-10-CM | POA: Diagnosis not present

## 2024-10-26 DIAGNOSIS — E785 Hyperlipidemia, unspecified: Secondary | ICD-10-CM | POA: Diagnosis not present

## 2024-10-26 DIAGNOSIS — Z7985 Long-term (current) use of injectable non-insulin antidiabetic drugs: Secondary | ICD-10-CM | POA: Diagnosis not present

## 2024-10-26 DIAGNOSIS — Z823 Family history of stroke: Secondary | ICD-10-CM | POA: Diagnosis not present

## 2024-10-26 DIAGNOSIS — Z794 Long term (current) use of insulin: Secondary | ICD-10-CM | POA: Diagnosis not present

## 2024-10-26 DIAGNOSIS — Z9989 Dependence on other enabling machines and devices: Secondary | ICD-10-CM | POA: Diagnosis not present

## 2024-10-26 DIAGNOSIS — I509 Heart failure, unspecified: Secondary | ICD-10-CM | POA: Diagnosis not present

## 2024-10-26 DIAGNOSIS — K219 Gastro-esophageal reflux disease without esophagitis: Secondary | ICD-10-CM | POA: Diagnosis not present

## 2024-10-26 DIAGNOSIS — F325 Major depressive disorder, single episode, in full remission: Secondary | ICD-10-CM | POA: Diagnosis not present

## 2024-10-26 DIAGNOSIS — I110 Hypertensive heart disease with heart failure: Secondary | ICD-10-CM | POA: Diagnosis not present

## 2024-11-03 DIAGNOSIS — E1159 Type 2 diabetes mellitus with other circulatory complications: Secondary | ICD-10-CM | POA: Diagnosis not present

## 2024-11-03 DIAGNOSIS — I10 Essential (primary) hypertension: Secondary | ICD-10-CM | POA: Diagnosis not present

## 2024-11-05 DIAGNOSIS — M79605 Pain in left leg: Secondary | ICD-10-CM | POA: Diagnosis not present

## 2024-11-05 DIAGNOSIS — M25561 Pain in right knee: Secondary | ICD-10-CM | POA: Diagnosis not present

## 2024-11-05 DIAGNOSIS — M25562 Pain in left knee: Secondary | ICD-10-CM | POA: Diagnosis not present

## 2024-11-10 DIAGNOSIS — E669 Obesity, unspecified: Secondary | ICD-10-CM | POA: Diagnosis not present

## 2024-11-10 DIAGNOSIS — I10 Essential (primary) hypertension: Secondary | ICD-10-CM | POA: Diagnosis not present

## 2024-11-10 DIAGNOSIS — M159 Polyosteoarthritis, unspecified: Secondary | ICD-10-CM | POA: Diagnosis not present

## 2024-11-10 DIAGNOSIS — E1159 Type 2 diabetes mellitus with other circulatory complications: Secondary | ICD-10-CM | POA: Diagnosis not present

## 2024-11-10 DIAGNOSIS — F3342 Major depressive disorder, recurrent, in full remission: Secondary | ICD-10-CM | POA: Diagnosis not present

## 2024-11-18 DIAGNOSIS — M25552 Pain in left hip: Secondary | ICD-10-CM | POA: Diagnosis not present

## 2024-11-18 DIAGNOSIS — M25562 Pain in left knee: Secondary | ICD-10-CM | POA: Diagnosis not present

## 2024-11-18 DIAGNOSIS — M25561 Pain in right knee: Secondary | ICD-10-CM | POA: Diagnosis not present

## 2024-11-18 DIAGNOSIS — G8929 Other chronic pain: Secondary | ICD-10-CM | POA: Diagnosis not present

## 2024-11-20 DIAGNOSIS — Z0000 Encounter for general adult medical examination without abnormal findings: Secondary | ICD-10-CM | POA: Diagnosis not present
